# Patient Record
Sex: Male | Born: 1990 | Race: White | Hispanic: No | Marital: Single
Health system: Southern US, Community
[De-identification: ages and names within clinical notes are randomized; demographics above are authoritative.]

## PROBLEM LIST (undated history)

## (undated) ENCOUNTER — Emergency Department (HOSPITAL_COMMUNITY): Payer: Self-pay

## (undated) DIAGNOSIS — F419 Anxiety disorder, unspecified: Secondary | ICD-10-CM

## (undated) DIAGNOSIS — W3400XA Accidental discharge from unspecified firearms or gun, initial encounter: Secondary | ICD-10-CM

## (undated) DIAGNOSIS — Z5189 Encounter for other specified aftercare: Secondary | ICD-10-CM

## (undated) HISTORY — DX: Anxiety disorder, unspecified: F41.9

## (undated) HISTORY — DX: Encounter for other specified aftercare: Z51.89

---

## 2020-04-06 ENCOUNTER — Emergency Department (HOSPITAL_COMMUNITY)
Admission: EM | Admit: 2020-04-06 | Discharge: 2020-04-06 | Disposition: A | Discharge status: Home / Self Care | Payer: Medicaid - Out of State | Attending: Emergency Medicine | Admitting: Emergency Medicine

## 2020-04-06 ENCOUNTER — Encounter (HOSPITAL_COMMUNITY): Payer: Self-pay

## 2020-04-06 DIAGNOSIS — K0889 Other specified disorders of teeth and supporting structures: Secondary | ICD-10-CM | POA: Diagnosis not present

## 2020-04-06 DIAGNOSIS — M549 Dorsalgia, unspecified: Secondary | ICD-10-CM | POA: Diagnosis not present

## 2020-04-06 DIAGNOSIS — Z5321 Procedure and treatment not carried out due to patient leaving prior to being seen by health care provider: Secondary | ICD-10-CM | POA: Insufficient documentation

## 2020-04-06 DIAGNOSIS — M542 Cervicalgia: Secondary | ICD-10-CM | POA: Insufficient documentation

## 2020-04-06 DIAGNOSIS — G8929 Other chronic pain: Secondary | ICD-10-CM | POA: Insufficient documentation

## 2020-04-06 NOTE — ED Triage Notes (Signed)
Pt presents via EMS with c/o dental pain. Pt reports dental pain with chronic neck and back pain with a hx of arthritis.

## 2021-06-10 ENCOUNTER — Emergency Department (HOSPITAL_COMMUNITY)
Admission: EM | Admit: 2021-06-10 | Discharge: 2021-06-10 | Disposition: A | Discharge status: Home / Self Care | Payer: Medicaid - Out of State | Attending: Emergency Medicine | Admitting: Emergency Medicine

## 2021-06-10 ENCOUNTER — Other Ambulatory Visit: Payer: Self-pay

## 2021-06-10 ENCOUNTER — Emergency Department (HOSPITAL_COMMUNITY): Payer: Medicaid - Out of State

## 2021-06-10 DIAGNOSIS — G8929 Other chronic pain: Secondary | ICD-10-CM | POA: Diagnosis not present

## 2021-06-10 DIAGNOSIS — R112 Nausea with vomiting, unspecified: Secondary | ICD-10-CM | POA: Diagnosis not present

## 2021-06-10 DIAGNOSIS — R6883 Chills (without fever): Secondary | ICD-10-CM | POA: Diagnosis not present

## 2021-06-10 DIAGNOSIS — R059 Cough, unspecified: Secondary | ICD-10-CM | POA: Diagnosis not present

## 2021-06-10 DIAGNOSIS — R0981 Nasal congestion: Secondary | ICD-10-CM | POA: Diagnosis not present

## 2021-06-10 DIAGNOSIS — M545 Low back pain, unspecified: Secondary | ICD-10-CM | POA: Insufficient documentation

## 2021-06-10 DIAGNOSIS — F17210 Nicotine dependence, cigarettes, uncomplicated: Secondary | ICD-10-CM | POA: Diagnosis not present

## 2021-06-10 DIAGNOSIS — Z79899 Other long term (current) drug therapy: Secondary | ICD-10-CM | POA: Diagnosis not present

## 2021-06-10 DIAGNOSIS — K029 Dental caries, unspecified: Secondary | ICD-10-CM | POA: Diagnosis not present

## 2021-06-10 LAB — COMPREHENSIVE METABOLIC PANEL
ALT: 17 U/L (ref 0–44)
AST: 20 U/L (ref 15–41)
Albumin: 4.1 g/dL (ref 3.5–5.0)
Alkaline Phosphatase: 67 U/L (ref 38–126)
Anion gap: 7 (ref 5–15)
BUN: 12 mg/dL (ref 6–20)
CO2: 23 mmol/L (ref 22–32)
Calcium: 9 mg/dL (ref 8.9–10.3)
Chloride: 107 mmol/L (ref 98–111)
Creatinine, Ser: 0.85 mg/dL (ref 0.61–1.24)
GFR, Estimated: >60 mL/min (ref >60)
Glucose, Bld: 96 mg/dL (ref 70–99)
Potassium: 3.5 mmol/L (ref 3.5–5.1)
Sodium: 137 mmol/L (ref 135–145)
Total Bilirubin: 0.9 mg/dL (ref 0.3–1.2)
Total Protein: 6.7 g/dL (ref 6.5–8.1)

## 2021-06-10 LAB — URINALYSIS, ROUTINE W REFLEX MICROSCOPIC
Bacteria, UA: NONE SEEN
Bilirubin Urine: NEGATIVE
Glucose, UA: NEGATIVE mg/dL
Hgb urine dipstick: NEGATIVE
Ketones, ur: 20 mg/dL — AB
Leukocytes,Ua: NEGATIVE
Nitrite: NEGATIVE
Protein, ur: 100 mg/dL — AB
Specific Gravity, Urine: 1.032 — ABNORMAL HIGH (ref 1.005–1.030)
pH: 5 (ref 5.0–8.0)

## 2021-06-10 LAB — CBC WITH DIFFERENTIAL/PLATELET
Abs Immature Granulocytes: 0.02 10*3/uL (ref 0.00–0.07)
Basophils Absolute: 0.1 10*3/uL (ref 0.0–0.1)
Basophils Relative: 1 %
Eosinophils Absolute: 0 10*3/uL (ref 0.0–0.5)
Eosinophils Relative: 0 %
HCT: 45.2 % (ref 39.0–52.0)
Hemoglobin: 15.2 g/dL (ref 13.0–17.0)
Immature Granulocytes: 0 %
Lymphocytes Relative: 9 %
Lymphs Abs: 0.6 10*3/uL — ABNORMAL LOW (ref 0.7–4.0)
MCH: 29.5 pg (ref 26.0–34.0)
MCHC: 33.6 g/dL (ref 30.0–36.0)
MCV: 87.8 fL (ref 80.0–100.0)
Monocytes Absolute: 1.1 10*3/uL — ABNORMAL HIGH (ref 0.1–1.0)
Monocytes Relative: 19 %
Neutro Abs: 4.3 10*3/uL (ref 1.7–7.7)
Neutrophils Relative %: 71 %
Platelets: 162 10*3/uL (ref 150–400)
RBC: 5.15 MIL/uL (ref 4.22–5.81)
RDW: 12.6 % (ref 11.5–15.5)
WBC: 6.1 10*3/uL (ref 4.0–10.5)
nRBC: 0 % (ref 0.0–0.2)

## 2021-06-10 LAB — RAPID URINE DRUG SCREEN, HOSP PERFORMED
Amphetamines: NOT DETECTED
Barbiturates: NOT DETECTED
Benzodiazepines: NOT DETECTED
Cocaine: NOT DETECTED
Opiates: NOT DETECTED
Tetrahydrocannabinol: POSITIVE — AB

## 2021-06-10 MED ORDER — OXYCODONE-ACETAMINOPHEN 5-325 MG PO TABS
1.0000 | ORAL_TABLET | Freq: Once | ORAL | Status: AC
Start: 2021-06-10 — End: 2021-06-10
  Administered 2021-06-10: 1 via ORAL
  Filled 2021-06-10: qty 1

## 2021-06-10 MED ORDER — METHOCARBAMOL 500 MG PO TABS: 500.0000 mg | ORAL_TABLET | Freq: Two times a day (BID) | ORAL | 0 refills | Status: DC

## 2021-06-10 NOTE — ED Triage Notes (Signed)
Pt BIBA from home  Per EMS pt reports hot/cold flashes and intermittent nausea/vomiting last night and this morning reports sharp pain in lower back when standing up. believes he may have had a syncopal episode but not completely sure. Denies any numbness/tingling. Pt reports he does have some back pain regularly due to heavy lifting at work but describes this pain as worse.    101/74 HR 104 CBG 152 T 97.4

## 2021-06-10 NOTE — Discharge Instructions (Addendum)
You are seen in the emergency department today for back pain.  Lab work and imaging that we obtained was all normal.  It is likely that your symptoms are related to overuse from work.  We normally treat this symptomatically.  I am prescribing you a muscle relaxer called Robaxin, you can take 1 tablet twice daily.  While taking this medication do not combine with alcohol as it can make you sleepy.  Do not operate machinery continue to have makes you feel.  Please use Tylenol or ibuprofen for pain.  You may use 600 mg ibuprofen every 6 hours or 1000 mg of Tylenol every 6 hours.  You may choose to alternate between the 2.  This would be most effective.  Not to exceed 4 g of Tylenol within 24 hours.  Not to exceed 3200 mg ibuprofen 24 hours.   Continue to monitor how you're doing and return to the ER for new or worsening symptoms such as numbness, tingling, weakness, not being able to hold your stool or urine.   It has been a pleasure seeing and caring for you today and I hope you start feeling better soon!

## 2021-06-10 NOTE — ED Provider Notes (Signed)
[facility]-EMERGENCY DEPT Provider Note   CSN: [id number] Arrival date & time: 06/10/21  0919     History Chief Complaint  Patient presents with   Back Pain   Nausea    Gabriel Reed is a 30 y.o. male with no significant past medical history who presents with back pain x1 day.  Patient states he has chronic back pain due to osteoarthritis, felt increase in pain last night.  When he woke up this morning about 5 AM he described "splitting" pain in his lower back with associated chills, nausea, and vomiting.  His pain is made worse with walking and movement.  Has not checked temperature at home.  Took Tylenol this morning without relief.  No recent injuries.  Patient states that he works at [employer], and does lots of heavy lifting.  Also feels that he has had a "cold" for the past several days with associated nasal congestion and intermittent productive cough.  No numbness, tingling, weakness.   Reports history of dental infections, has not followed up with a dentist in several years.  Smokes 1 pack cigarettes per day, no alcohol use.  Endorses marijuana use.  No IV drug use.   Back Pain Associated symptoms: no abdominal pain, no chest pain and no dysuria       No past medical history on file.  There are no problems to display for this patient.   No past surgical history on file.     No family history on file.  Social History   Tobacco Use   Smoking status: Every Day   Smokeless tobacco: Never  Substance Use Topics   Alcohol use: Never   Drug use: Yes    Types: Marijuana    Home Medications Prior to Admission medications   Medication Sig Start Date End Date Taking? Authorizing Provider  methocarbamol (ROBAXIN) 500 MG tablet Take 1 tablet (500 mg total) by mouth 2 (two) times daily. 06/10/21  Yes Lisamarie Coke T, PA-C    Allergies    Amoxicillin, Bee venom, and Penicillins  Review of Systems   Review of Systems  Constitutional:  Positive  for chills.  HENT:  Positive for congestion. Negative for sore throat and trouble swallowing.   Respiratory:  Positive for cough. Negative for shortness of breath.   Cardiovascular:  Negative for chest pain.  Gastrointestinal:  Positive for nausea and vomiting. Negative for abdominal pain, constipation and diarrhea.  Genitourinary:  Negative for dysuria, frequency, hematuria and urgency.  Musculoskeletal:  Positive for back pain.  All other systems reviewed and are negative.  Physical Exam Updated Vital Signs BP 117/82   Pulse 68   Temp 98.7 F (37.1 C) (Oral)   Resp 16   Ht 6\' 1"  (1.854 m)   Wt 76.7 kg   SpO2 98%   BMI 22.30 kg/m   Physical Exam Vitals and nursing note reviewed.  Constitutional:      Appearance: Normal appearance.  HENT:     Head: Normocephalic and atraumatic.     Mouth/Throat:     Mouth: Mucous membranes are moist.     Pharynx: No oropharyngeal exudate or posterior oropharyngeal erythema.     Comments: Poor dentition with several missing teeth and caries. No obvious dental abscess.  Eyes:     Conjunctiva/sclera: Conjunctivae normal.  Cardiovascular:     Rate and Rhythm: Normal rate and regular rhythm.     Heart sounds: Normal heart sounds.  Pulmonary:     Effort: Pulmonary effort  is normal. No respiratory distress.     Breath sounds: Normal breath sounds.  Abdominal:     General: There is no distension.     Palpations: Abdomen is soft.     Tenderness: There is no abdominal tenderness. There is no right CVA tenderness, left CVA tenderness, guarding or rebound.  Musculoskeletal:     Comments: Full passive ROM of lumbar spine.  Point tenderness over lumbar spine no step-offs or crepitus.  Skin:    General: Skin is warm and dry.     Comments: No wounds or overlying skin changes.  Neurological:     General: No focal deficit present.     Mental Status: He is alert.     Comments: Neuro: Speech is clear, able to follow commands. Sensation intact  throughout. Str 5/5 all extremities.     ED Results / Procedures / Treatments   Labs (all labs ordered are listed, but only abnormal results are displayed) Labs Reviewed  CBC WITH DIFFERENTIAL/PLATELET - Abnormal; Notable for the following components:      Result Value   Lymphs Abs 0.6 (*)    Monocytes Absolute 1.1 (*)    All other components within normal limits  URINALYSIS, ROUTINE W REFLEX MICROSCOPIC - Abnormal; Notable for the following components:   Color, Urine AMBER (*)    APPearance HAZY (*)    Specific Gravity, Urine 1.032 (*)    Ketones, ur 20 (*)    Protein, ur 100 (*)    All other components within normal limits  RAPID URINE DRUG SCREEN, HOSP PERFORMED - Abnormal; Notable for the following components:   Tetrahydrocannabinol POSITIVE (*)    All other components within normal limits  COMPREHENSIVE METABOLIC PANEL    EKG None  Radiology CT Lumbar Spine Wo Contrast  Result Date: 06/10/2021 CLINICAL DATA:  Sharp back pain.  Possible syncope EXAM: CT LUMBAR SPINE WITHOUT CONTRAST TECHNIQUE: Multidetector CT imaging of the lumbar spine was performed without intravenous contrast administration. Multiplanar CT image reconstructions were also generated. COMPARISON:  None. FINDINGS: Segmentation: 5 lumbar type vertebrae. Alignment: Normal. Vertebrae: No acute fracture or focal pathologic process. Paraspinal and other soft tissues: Negative Disc levels: No visible herniation or focal degenerative changes. IMPRESSION: Negative study Electronically Signed   By: Tiburcio Pea M.D.   On: 06/10/2021 10:36    Procedures Procedures   Medications Ordered in ED Medications  oxyCODONE-acetaminophen (PERCOCET/ROXICET) 5-325 MG per tablet 1 tablet (1 tablet Oral Given 06/10/21 1008)    ED Course  I have reviewed the triage vital signs and the nursing notes.  Pertinent labs & imaging results that were available during my care of the patient were reviewed by me and considered in my  medical decision making (see chart for details).    MDM Rules/Calculators/A&P                           Patient is 30 y/o male who presents with acute back pain with associated chills, nausea, and vomiting x 1 day.  States he does lots of heavy lifting at work, woke up this morning with "splitting" back pain.  Reports history of dental infections.  Does not take any medications on a daily basis.  No loss of bowel or bladder control.  No concern for cauda equina.  No fever, night sweats, weight loss, h/o cancer, IVDU.   The emergent differential diagnosis for back pain includes but is not limited to fracture, muscle strain,  cauda equina, spinal stenosis. DDD, ankylosing spondylitis, acute ligamentous injury, disk herniation, spondylolisthesis, Epidural compression syndrome, metastatic cancer, transverse myelitis, vertebral osteomyelitis, diskitis, kidney stone, pyelonephritis, AAA, Perforated ulcer, Retrocecal appendicitis, pancreatitis, bowel obstruction, retroperitoneal hemorrhage or mass, meningitis.   On my exam patient is afebrile, no acute distress.  He has point tenderness over his lumbar spine.  No step-offs or crepitus.  No wounds or overlying skin changes.  Full passive ROM of lumbar spine.  Neurovascularly intact as above.  Lab work unremarkable.  Urinalysis consistent with dehydration, no evidence of UTI or hematuria.  UDS positive for THC.  CT lumbar spine shows no acute abnormalities.  Highly doubt infection as patient is afebrile, there is no leukocytosis, and no inflammatory changes on imaging.  Likely symptoms related to overuse.  She does not require admission or inpatient treatment for his symptoms at this time.  Plan to discharge home with prescription for muscle relaxer.  Given strict return precautions. Patient agreeable to plan.  Final Clinical Impression(s) / ED Diagnoses Final diagnoses:  Acute midline low back pain without sciatica    Rx / DC Orders ED Discharge Orders           Ordered    methocarbamol (ROBAXIN) 500 MG tablet  2 times daily        06/10/21 1152             Marquisha Nikolov T, PA-C 06/10/21 1155    Derwood Kaplan, MD 06/10/21 1439

## 2022-02-21 ENCOUNTER — Ambulatory Visit: Payer: Medicaid - Out of State | Admitting: Family Medicine

## 2022-03-14 ENCOUNTER — Ambulatory Visit: Payer: Self-pay

## 2022-04-26 ENCOUNTER — Other Ambulatory Visit: Payer: Self-pay

## 2022-04-26 ENCOUNTER — Encounter (HOSPITAL_COMMUNITY): Payer: Self-pay

## 2022-04-26 ENCOUNTER — Emergency Department (HOSPITAL_COMMUNITY)
Admission: EM | Admit: 2022-04-26 | Discharge: 2022-04-26 | Disposition: A | Discharge status: Home / Self Care | Payer: Medicaid - Out of State | Attending: Emergency Medicine | Admitting: Emergency Medicine

## 2022-04-26 DIAGNOSIS — K029 Dental caries, unspecified: Secondary | ICD-10-CM | POA: Insufficient documentation

## 2022-04-26 MED ORDER — CLINDAMYCIN HCL 150 MG PO CAPS: 300.0000 mg | ORAL_CAPSULE | Freq: Three times a day (TID) | ORAL | 0 refills | Status: AC

## 2022-04-26 NOTE — ED Provider Notes (Signed)
[facility]   CSN: [id number] Arrival date & time: 04/26/22  6283     History  Chief Complaint  Patient presents with   Dental Pain    Gabriel Reed is a 31 y.o. male.   Dental Pain    Patient presents to the ED for toothache.  Patient states he has a history of multiple dental caries.  He has had multiple teeth pulled.  Last time he saw dentist was 4 years ago.  Patient states after he had his teeth pulled this new 1 ended up coming up through the gums.  He has been having some intermittent issues with it since then.  Patient states he started having pain that increased in the last few days.  He is not having any fevers or chills.  No difficulty swallowing or speaking.  Patient states usually when he has a flare antibiotics help it.  He came to the ED for that.  Home Medications Prior to Admission medications   Medication Sig Start Date End Date Taking? Authorizing Provider  clindamycin (CLEOCIN) 150 MG capsule Take 2 capsules (300 mg total) by mouth 3 (three) times daily for 7 days. 04/26/22 05/03/22 Yes Linwood Dibbles, MD  methocarbamol (ROBAXIN) 500 MG tablet Take 1 tablet (500 mg total) by mouth 2 (two) times daily. 06/10/21   Roemhildt, Lorin T, PA-C      Allergies    Amoxicillin, Bee venom, and Penicillins    Review of Systems   Review of Systems  Physical Exam Updated Vital Signs BP 122/83 (BP Location: Right Arm)   Pulse 67   Temp 98.3 F (36.8 C) (Oral)   Resp 16   Ht 1.854 m (6\' 1" )   Wt 77.1 kg   SpO2 94%   BMI 22.43 kg/m  Physical Exam Vitals and nursing note reviewed.  Constitutional:      General: He is not in acute distress.    Appearance: He is well-developed.  HENT:     Head: Normocephalic and atraumatic.     Right Ear: External ear normal.     Left Ear: External ear normal.     Mouth/Throat:     Dentition: Dental caries present. No gingival swelling or dental abscesses.     Pharynx: No oropharyngeal  exudate or posterior oropharyngeal erythema.     Comments: Right posterior molar region tooth with obvious decay, it is brown in color, no surrounding erythema or swelling Eyes:     General: No scleral icterus.       Right eye: No discharge.        Left eye: No discharge.     Conjunctiva/sclera: Conjunctivae normal.  Neck:     Trachea: No tracheal deviation.  Cardiovascular:     Rate and Rhythm: Normal rate.  Pulmonary:     Effort: Pulmonary effort is normal. No respiratory distress.     Breath sounds: No stridor.  Abdominal:     General: There is no distension.  Musculoskeletal:        General: No swelling or deformity.     Cervical back: Neck supple.  Skin:    General: Skin is warm and dry.     Findings: No rash.  Neurological:     Mental Status: He is alert.     Cranial Nerves: Cranial nerve deficit: no gross deficits.     ED Results / Procedures / Treatments   Labs (all labs ordered are listed, but only abnormal results are displayed)  Labs Reviewed - No data to display  EKG None  Radiology No results found.  Procedures Procedures    Medications Ordered in ED Medications - No data to display  ED Course/ Medical Decision Making/ A&P                           Medical Decision Making Problems Addressed: Dental caries: acute illness or injury   Patient appears to have a decayed tooth.  Suspect he had a wisdom tooth that has erupted through the gums after having his other teeth pulled.  Will start him on a course of antibiotics for possible dental infection although there is no signs of severe swelling or obvious abscess.  Recommend close outpatient follow-up with a dentist.  Will provide outpatient resource information        Final Clinical Impression(s) / ED Diagnoses Final diagnoses:  Dental caries    Rx / DC Orders ED Discharge Orders          Ordered    clindamycin (CLEOCIN) 150 MG capsule  3 times daily        04/26/22 0903               Linwood Dibbles, MD 04/26/22 [id number]

## 2022-04-26 NOTE — Discharge Instructions (Signed)
Follow-up with a dentist as soon as possible.

## 2022-04-26 NOTE — ED Notes (Signed)
Pt stated he is having R sided jaw pain and "every time this happens it's usually infected". Pt a/o and conversational at this time. No immediate complaints noted.

## 2022-04-26 NOTE — ED Triage Notes (Signed)
Patient c/o right lower dental pain x 2 months and worse in the past few days. Patient states when he swallows he has pain in his right ear and throat.

## 2022-12-19 ENCOUNTER — Emergency Department (HOSPITAL_COMMUNITY): Payer: Medicaid Other

## 2022-12-19 ENCOUNTER — Other Ambulatory Visit: Payer: Self-pay

## 2022-12-19 ENCOUNTER — Emergency Department (HOSPITAL_COMMUNITY): Payer: Medicaid Other | Admitting: Anesthesiology

## 2022-12-19 ENCOUNTER — Encounter (HOSPITAL_COMMUNITY): Admission: EM | Disposition: A | Discharge status: Home / Self Care | Payer: Self-pay | Source: Home / Self Care

## 2022-12-19 ENCOUNTER — Inpatient Hospital Stay (HOSPITAL_COMMUNITY)
Admission: EM | Admit: 2022-12-19 | Discharge: 2022-12-23 | DRG: 163 | Disposition: A | Discharge status: Home / Self Care | Payer: Medicaid Other | Attending: General Surgery | Admitting: General Surgery

## 2022-12-19 DIAGNOSIS — Z880 Allergy status to penicillin: Secondary | ICD-10-CM | POA: Diagnosis not present

## 2022-12-19 DIAGNOSIS — F17210 Nicotine dependence, cigarettes, uncomplicated: Secondary | ICD-10-CM

## 2022-12-19 DIAGNOSIS — Z91030 Bee allergy status: Secondary | ICD-10-CM

## 2022-12-19 DIAGNOSIS — R578 Other shock: Secondary | ICD-10-CM | POA: Diagnosis present

## 2022-12-19 DIAGNOSIS — F4310 Post-traumatic stress disorder, unspecified: Secondary | ICD-10-CM | POA: Diagnosis present

## 2022-12-19 DIAGNOSIS — S21131A Puncture wound without foreign body of right front wall of thorax without penetration into thoracic cavity, initial encounter: Secondary | ICD-10-CM | POA: Diagnosis present

## 2022-12-19 DIAGNOSIS — Y9259 Other trade areas as the place of occurrence of the external cause: Secondary | ICD-10-CM

## 2022-12-19 DIAGNOSIS — S31139A Puncture wound of abdominal wall without foreign body, unspecified quadrant without penetration into peritoneal cavity, initial encounter: Secondary | ICD-10-CM | POA: Diagnosis not present

## 2022-12-19 DIAGNOSIS — F4322 Adjustment disorder with anxiety: Secondary | ICD-10-CM | POA: Diagnosis present

## 2022-12-19 DIAGNOSIS — J9601 Acute respiratory failure with hypoxia: Secondary | ICD-10-CM | POA: Diagnosis present

## 2022-12-19 DIAGNOSIS — S2231XA Fracture of one rib, right side, initial encounter for closed fracture: Secondary | ICD-10-CM | POA: Diagnosis present

## 2022-12-19 DIAGNOSIS — W3400XA Accidental discharge from unspecified firearms or gun, initial encounter: Secondary | ICD-10-CM | POA: Diagnosis not present

## 2022-12-19 DIAGNOSIS — Z23 Encounter for immunization: Secondary | ICD-10-CM

## 2022-12-19 DIAGNOSIS — F430 Acute stress reaction: Secondary | ICD-10-CM | POA: Diagnosis present

## 2022-12-19 DIAGNOSIS — S271XXA Traumatic hemothorax, initial encounter: Secondary | ICD-10-CM | POA: Diagnosis present

## 2022-12-19 DIAGNOSIS — D62 Acute posthemorrhagic anemia: Secondary | ICD-10-CM | POA: Diagnosis present

## 2022-12-19 DIAGNOSIS — S272XXA Traumatic hemopneumothorax, initial encounter: Secondary | ICD-10-CM | POA: Diagnosis not present

## 2022-12-19 HISTORY — PX: THORACOTOMY: SHX5074

## 2022-12-19 LAB — I-STAT CHEM 8, ED
BUN: 15 mg/dL (ref 6–20)
Calcium, Ion: 1.24 mmol/L (ref 1.15–1.40)
Chloride: 102 mmol/L (ref 98–111)
Creatinine, Ser: 1 mg/dL (ref 0.61–1.24)
Glucose, Bld: 111 mg/dL — ABNORMAL HIGH (ref 70–99)
HCT: 44 % (ref 39.0–52.0)
Hemoglobin: 15 g/dL (ref 13.0–17.0)
Potassium: 4.2 mmol/L (ref 3.5–5.1)
Sodium: 140 mmol/L (ref 135–145)
TCO2: 29 mmol/L (ref 22–32)

## 2022-12-19 LAB — CBC
HCT: 43 % (ref 39.0–52.0)
Hemoglobin: 14.5 g/dL (ref 13.0–17.0)
MCH: 29.7 pg (ref 26.0–34.0)
MCHC: 33.7 g/dL (ref 30.0–36.0)
MCV: 87.9 fL (ref 80.0–100.0)
Platelets: 281 10*3/uL (ref 150–400)
RBC: 4.89 MIL/uL (ref 4.22–5.81)
RDW: 12.9 % (ref 11.5–15.5)
WBC: 14 10*3/uL — ABNORMAL HIGH (ref 4.0–10.5)
nRBC: 0 % (ref 0.0–0.2)

## 2022-12-19 LAB — COMPREHENSIVE METABOLIC PANEL
ALT: 14 U/L (ref 0–44)
AST: 19 U/L (ref 15–41)
Albumin: 3.2 g/dL — ABNORMAL LOW (ref 3.5–5.0)
Alkaline Phosphatase: 62 U/L (ref 38–126)
Anion gap: 12 (ref 5–15)
BUN: 13 mg/dL (ref 6–20)
CO2: 22 mmol/L (ref 22–32)
Calcium: 8.8 mg/dL — ABNORMAL LOW (ref 8.9–10.3)
Chloride: 104 mmol/L (ref 98–111)
Creatinine, Ser: 1.09 mg/dL (ref 0.61–1.24)
GFR, Estimated: >60 mL/min (ref >60)
Glucose, Bld: 134 mg/dL — ABNORMAL HIGH (ref 70–99)
Potassium: 4 mmol/L (ref 3.5–5.1)
Sodium: 138 mmol/L (ref 135–145)
Total Bilirubin: 0.5 mg/dL (ref 0.3–1.2)
Total Protein: 5.3 g/dL — ABNORMAL LOW (ref 6.5–8.1)

## 2022-12-19 LAB — PREPARE RBC (CROSSMATCH)

## 2022-12-19 LAB — PROTIME-INR
INR: 1.2 (ref 0.8–1.2)
Prothrombin Time: 15.4 seconds — ABNORMAL HIGH (ref 11.4–15.2)

## 2022-12-19 LAB — ETHANOL: Alcohol, Ethyl (B): <10 mg/dL (ref <10)

## 2022-12-19 LAB — PREPARE FRESH FROZEN PLASMA

## 2022-12-19 LAB — BPAM RBC
Blood Product Expiration Date: 202404302359
ISSUE DATE / TIME: 202404241530
ISSUE DATE / TIME: 202404252115
Unit Type and Rh: 5100
Unit Type and Rh: 9500

## 2022-12-19 LAB — TYPE AND SCREEN
Unit division: 0
Unit division: 0

## 2022-12-19 LAB — BPAM PLATELET PHERESIS
Blood Product Expiration Date: 202404272359
Unit Type and Rh: 6200

## 2022-12-19 LAB — BPAM FFP
Blood Product Expiration Date: 202404292359
ISSUE DATE / TIME: 202404252127
ISSUE DATE / TIME: 202404252226
Unit Type and Rh: 6200

## 2022-12-19 LAB — BPAM CRYOPRECIPITATE

## 2022-12-19 LAB — LACTIC ACID, PLASMA: Lactic Acid, Venous: 3.1 mmol/L (ref 0.5–1.9)

## 2022-12-19 LAB — PREPARE PLATELET PHERESIS

## 2022-12-19 LAB — ABO/RH: ABO/RH(D): O NEG

## 2022-12-19 SURGERY — THORACOTOMY, MAJOR
Anesthesia: General | Site: Chest | Laterality: Right

## 2022-12-19 MED ORDER — HEMOSTATIC AGENTS (NO CHARGE) OPTIME
TOPICAL | Status: DC | PRN
  Administered 2022-12-19: 1 via TOPICAL

## 2022-12-19 MED ORDER — IOHEXOL 350 MG/ML SOLN
75.0000 mL | Freq: Once | INTRAVENOUS | Status: AC | PRN
  Administered 2022-12-19: 75 mL via INTRAVENOUS

## 2022-12-19 MED ORDER — SODIUM CHLORIDE 0.9 % IV BOLUS
1000.0000 mL | Freq: Once | INTRAVENOUS | Status: AC
  Administered 2022-12-19: 1000 mL via INTRAVENOUS

## 2022-12-19 MED ORDER — TETANUS-DIPHTH-ACELL PERTUSSIS 5-2.5-18.5 LF-MCG/0.5 IM SUSY
0.5000 mL | PREFILLED_SYRINGE | Freq: Once | INTRAMUSCULAR | Status: AC
  Administered 2022-12-19: 0.5 mL via INTRAMUSCULAR

## 2022-12-19 MED ORDER — MORPHINE SULFATE (PF) 4 MG/ML IV SOLN: 4.0000 mg | Freq: Once | INTRAVENOUS | Status: DC

## 2022-12-19 MED ORDER — LACTATED RINGERS IV BOLUS
1000.0000 mL | Freq: Once | INTRAVENOUS | Status: AC
  Administered 2022-12-19: 1000 mL via INTRAVENOUS

## 2022-12-19 MED ORDER — FENTANYL CITRATE (PF) 250 MCG/5ML IJ SOLN
INTRAMUSCULAR | Status: AC
  Filled 2022-12-19: qty 5

## 2022-12-19 MED ORDER — 0.9 % SODIUM CHLORIDE (POUR BTL) OPTIME
TOPICAL | Status: DC | PRN
  Administered 2022-12-19: 2000 mL

## 2022-12-19 MED ORDER — DEXAMETHASONE SODIUM PHOSPHATE 10 MG/ML IJ SOLN
INTRAMUSCULAR | Status: DC | PRN
  Administered 2022-12-19: 10 mg via INTRAVENOUS

## 2022-12-19 MED ORDER — LACTATED RINGERS IV SOLN: INTRAVENOUS | Status: DC | PRN

## 2022-12-19 MED ORDER — PROPOFOL 10 MG/ML IV BOLUS
INTRAVENOUS | Status: AC
  Filled 2022-12-19: qty 20

## 2022-12-19 MED ORDER — SODIUM CHLORIDE 0.9% IV SOLUTION: Freq: Once | INTRAVENOUS | Status: DC

## 2022-12-19 MED ORDER — PHENYLEPHRINE 80 MCG/ML (10ML) SYRINGE FOR IV PUSH (FOR BLOOD PRESSURE SUPPORT)
PREFILLED_SYRINGE | INTRAVENOUS | Status: DC | PRN
  Administered 2022-12-19 (×2): 160 ug via INTRAVENOUS

## 2022-12-19 MED ORDER — CALCIUM GLUCONATE-NACL 1-0.675 GM/50ML-% IV SOLN
1.0000 g | Freq: Once | INTRAVENOUS | Status: AC
  Administered 2022-12-20: 1000 mg via INTRAVENOUS
  Filled 2022-12-19: qty 50

## 2022-12-19 MED ORDER — MORPHINE SULFATE (PF) 2 MG/ML IV SOLN
INTRAVENOUS | Status: AC
  Administered 2022-12-19: 4 mg via INTRAVENOUS
  Filled 2022-12-19: qty 2

## 2022-12-19 MED ORDER — HYDROMORPHONE HCL 1 MG/ML IJ SOLN
INTRAMUSCULAR | Status: AC
  Filled 2022-12-19: qty 0.5

## 2022-12-19 MED ORDER — LIDOCAINE 2% (20 MG/ML) 5 ML SYRINGE
INTRAMUSCULAR | Status: DC | PRN
  Administered 2022-12-19: 60 mg via INTRAVENOUS

## 2022-12-19 MED ORDER — SODIUM CHLORIDE 0.9 % IV SOLN: INTRAVENOUS | Status: DC | PRN

## 2022-12-19 MED ORDER — SUCCINYLCHOLINE CHLORIDE 200 MG/10ML IV SOSY
PREFILLED_SYRINGE | INTRAVENOUS | Status: DC | PRN
  Administered 2022-12-19: 120 mg via INTRAVENOUS

## 2022-12-19 MED ORDER — ROCURONIUM BROMIDE 10 MG/ML (PF) SYRINGE
PREFILLED_SYRINGE | INTRAVENOUS | Status: DC | PRN
  Administered 2022-12-19: 70 mg via INTRAVENOUS

## 2022-12-19 MED ORDER — KETAMINE HCL 50 MG/5ML IJ SOSY
PREFILLED_SYRINGE | INTRAMUSCULAR | Status: AC
  Filled 2022-12-19: qty 5

## 2022-12-19 MED ORDER — KETAMINE HCL 10 MG/ML IJ SOLN
INTRAMUSCULAR | Status: DC | PRN
  Administered 2022-12-19: 30 mg via INTRAVENOUS
  Administered 2022-12-19: 20 mg via INTRAVENOUS
  Administered 2022-12-20: 10 mg via INTRAVENOUS
  Administered 2022-12-20: 20 mg via INTRAVENOUS

## 2022-12-19 MED ORDER — PROPOFOL 10 MG/ML IV BOLUS
INTRAVENOUS | Status: DC | PRN
  Administered 2022-12-19: 150 mg via INTRAVENOUS
  Administered 2022-12-19: 50 mg via INTRAVENOUS
  Administered 2022-12-19: 70 mg via INTRAVENOUS

## 2022-12-19 MED ORDER — HYDROMORPHONE HCL 1 MG/ML IJ SOLN
INTRAMUSCULAR | Status: DC | PRN
  Administered 2022-12-19 (×2): .5 mg via INTRAVENOUS

## 2022-12-19 MED ORDER — ACETAMINOPHEN 10 MG/ML IV SOLN
INTRAVENOUS | Status: AC
  Filled 2022-12-19: qty 100

## 2022-12-19 MED ORDER — CEFAZOLIN SODIUM-DEXTROSE 2-4 GM/100ML-% IV SOLN
2.0000 g | Freq: Once | INTRAVENOUS | Status: AC
  Administered 2022-12-19: 2 g via INTRAVENOUS

## 2022-12-19 MED ORDER — PHENYLEPHRINE HCL-NACL 20-0.9 MG/250ML-% IV SOLN
INTRAVENOUS | Status: DC | PRN
  Administered 2022-12-19: 40 ug/min via INTRAVENOUS

## 2022-12-19 SURGICAL SUPPLY — 81 items
APPLIER CLIP ROT 10 11.4 M/L (STAPLE)
BIT DRILL 7/64X5 DISP (BIT) IMPLANT
BLADE CLIPPER SURG (BLADE) ×1 IMPLANT
CANISTER SUCT 3000ML PPV (MISCELLANEOUS) ×2 IMPLANT
CATH THORACIC 28FR (CATHETERS) IMPLANT
CATH THORACIC 28FR RT ANG (CATHETERS) IMPLANT
CATH THORACIC 36FR (CATHETERS) IMPLANT
CATH THORACIC 36FR RT ANG (CATHETERS) IMPLANT
CLIP APPLIE ROT 10 11.4 M/L (STAPLE) IMPLANT
CLIP TI MEDIUM 6 (CLIP) ×1 IMPLANT
CNTNR URN SCR LID CUP LEK RST (MISCELLANEOUS) IMPLANT
CONN ST 1/4X3/8  BEN (MISCELLANEOUS) ×1
CONN ST 1/4X3/8 BEN (MISCELLANEOUS) IMPLANT
CONT SPEC 4OZ STRL OR WHT (MISCELLANEOUS) ×1
DERMABOND ADVANCED .7 DNX12 (GAUZE/BANDAGES/DRESSINGS) IMPLANT
DRAPE ORTHO SPLIT 77X108 STRL (DRAPES) ×1
DRAPE POUCH INSTRU U-SHP 10X18 (DRAPES) IMPLANT
DRAPE SURG ORHT 6 SPLT 77X108 (DRAPES) IMPLANT
DRSG AQUACEL AG ADV 3.5X14 (GAUZE/BANDAGES/DRESSINGS) ×1 IMPLANT
ELECT BLADE 6.5 EXT (BLADE) ×1 IMPLANT
ELECT REM PT RETURN 9FT ADLT (ELECTROSURGICAL) ×1
ELECTRODE REM PT RTRN 9FT ADLT (ELECTROSURGICAL) ×1 IMPLANT
FELT TEFLON 1X6 (MISCELLANEOUS) ×1 IMPLANT
GAUZE 4X4 16PLY ~~LOC~~+RFID DBL (SPONGE) ×1 IMPLANT
GAUZE SPONGE 4X4 12PLY STRL (GAUZE/BANDAGES/DRESSINGS) ×1 IMPLANT
GLOVE SS BIOGEL STRL SZ 7.5 (GLOVE) ×1 IMPLANT
GLOVE SURG SIGNA 7.5 PF LTX (GLOVE) ×3 IMPLANT
GOWN STRL REUS W/ TWL LRG LVL3 (GOWN DISPOSABLE) ×2 IMPLANT
GOWN STRL REUS W/ TWL XL LVL3 (GOWN DISPOSABLE) ×1 IMPLANT
GOWN STRL REUS W/TWL LRG LVL3 (GOWN DISPOSABLE) ×2
GOWN STRL REUS W/TWL XL LVL3 (GOWN DISPOSABLE) ×1
HEMOSTAT SURGICEL 2X14 (HEMOSTASIS) IMPLANT
INSERT FOGARTY 61MM (MISCELLANEOUS) IMPLANT
KIT BASIN OR (CUSTOM PROCEDURE TRAY) ×1 IMPLANT
KIT SUCTION CATH 14FR (SUCTIONS) ×1 IMPLANT
KIT TURNOVER KIT B (KITS) ×1 IMPLANT
NS IRRIG 1000ML POUR BTL (IV SOLUTION) ×3 IMPLANT
PACK CHEST (CUSTOM PROCEDURE TRAY) ×1 IMPLANT
PACK UNIVERSAL I (CUSTOM PROCEDURE TRAY) IMPLANT
PAD ARMBOARD 7.5X6 YLW CONV (MISCELLANEOUS) ×2 IMPLANT
PASSER SUT SWANSON 36MM LOOP (INSTRUMENTS) IMPLANT
PENCIL BUTTON HOLSTER BLD 10FT (ELECTRODE) IMPLANT
RELOAD STAPLE 60 BLK XTHK ART (STAPLE) IMPLANT
RELOAD TRI 2.0 60 XTHK VAS SUL (STAPLE) ×3 IMPLANT
SEALANT SURG COSEAL 8ML (VASCULAR PRODUCTS) IMPLANT
SPONGE T-LAP 18X18 ~~LOC~~+RFID (SPONGE) ×4 IMPLANT
SPONGE T-LAP 4X18 ~~LOC~~+RFID (SPONGE) ×1 IMPLANT
SPONGE TONSIL 1 RF SGL (DISPOSABLE) ×1 IMPLANT
STAPLER ENDO GIA 12 SHRT THIN (STAPLE) IMPLANT
STAPLER ENDO GIA 12MM SHORT (STAPLE) ×1 IMPLANT
STAPLER VISISTAT 35W (STAPLE) IMPLANT
STOPCOCK 4 WAY LG BORE MALE ST (IV SETS) ×1 IMPLANT
SUT CHROMIC 4 0 SH 27 (SUTURE) IMPLANT
SUT PROLENE 1 XLH (SUTURE) ×4 IMPLANT
SUT PROLENE 2 0 MH 48 (SUTURE) IMPLANT
SUT PROLENE 2 0 SH DA (SUTURE) IMPLANT
SUT PROLENE 3 0 SH 48 (SUTURE) IMPLANT
SUT PROLENE 4 0 RB 1 (SUTURE) ×5
SUT PROLENE 4 0 SH DA (SUTURE) IMPLANT
SUT PROLENE 4-0 RB1 .5 CRCL 36 (SUTURE) ×1 IMPLANT
SUT PROLENE 4-0 RB1 18X2 ARM (SUTURE) IMPLANT
SUT SILK  1 MH (SUTURE) ×2
SUT SILK 1 MH (SUTURE) ×2 IMPLANT
SUT SILK 2 0SH CR/8 30 (SUTURE) ×1 IMPLANT
SUT VIC AB 1 CTX 18 (SUTURE) IMPLANT
SUT VIC AB 1 CTX 27 (SUTURE) IMPLANT
SUT VIC AB 1 CTX 36 (SUTURE)
SUT VIC AB 1 CTX36XBRD ANBCTR (SUTURE) ×1 IMPLANT
SUT VIC AB 2-0 CTX 27 (SUTURE) IMPLANT
SUT VIC AB 3-0 X1 27 (SUTURE) ×1 IMPLANT
SUT VICRYL 2 TP 1 (SUTURE) ×1 IMPLANT
SWAB CULTURE ESWAB REG 1ML (MISCELLANEOUS) IMPLANT
SYR 10ML LL (SYRINGE) ×1 IMPLANT
SYR 20ML LL LF (SYRINGE) IMPLANT
SYR 50ML LL SCALE MARK (SYRINGE) ×1 IMPLANT
SYSTEM SAHARA CHEST DRAIN ATS (WOUND CARE) ×1 IMPLANT
TAPE CLOTH SURG 4X10 WHT LF (GAUZE/BANDAGES/DRESSINGS) IMPLANT
TOWEL GREEN STERILE (TOWEL DISPOSABLE) ×2 IMPLANT
TRAY FOLEY SLVR 16FR LF STAT (SET/KITS/TRAYS/PACK) ×1 IMPLANT
TUBING EXTENTION W/L.L. (IV SETS) IMPLANT
WATER STERILE IRR 1000ML POUR (IV SOLUTION) ×1 IMPLANT

## 2022-12-19 NOTE — Anesthesia Procedure Notes (Signed)
Procedure Name: Intubation Date/Time: 12/19/2022 10:47 PM  Performed by: Molli Hazard, CRNAPre-anesthesia Checklist: Patient identified, Emergency Drugs available, Suction available and Patient being monitored Patient Re-evaluated:Patient Re-evaluated prior to induction Oxygen Delivery Method: Circle system utilized Preoxygenation: Pre-oxygenation with 100% oxygen Induction Type: IV induction and Rapid sequence Laryngoscope Size: Glidescope Grade View: Grade I Endobronchial tube: Left, EBT position confirmed by auscultation, EBT position confirmed by fiberoptic bronchoscope and Double lumen EBT Number of attempts: 2 Airway Equipment and Method: Stylet Placement Confirmation: ETT inserted through vocal cords under direct vision, positive ETCO2 and breath sounds checked- equal and bilateral Tube secured with: Tape Dental Injury: Teeth and Oropharynx as per pre-operative assessment

## 2022-12-19 NOTE — ED Notes (Signed)
Cardiothoracic consulted
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
## 2022-12-19 NOTE — Anesthesia Procedure Notes (Signed)
Central Venous Catheter Insertion Performed by: Beryle Lathe, MD, anesthesiologist Start/End4/25/2024 9:58 PM, 12/19/2022 10:05 PM Patient location: OR. Emergency situation Preanesthetic checklist: patient identified, IV checked, monitors and equipment checked, pre-op evaluation, timeout performed and anesthesia consent Position: supine Patient sedated Hand hygiene performed , maximum sterile barriers used  and Seldinger technique used Catheter size: 8 Fr Central line was placed.Double lumen Procedure performed using ultrasound guided technique. Ultrasound Notes:anatomy identified, needle tip was noted to be adjacent to the nerve/plexus identified, no ultrasound evidence of intravascular and/or intraneural injection and image(s) printed for medical record Attempts: 1 Following insertion, line sutured, dressing applied and Biopatch. Post procedure assessment: blood return through all ports, free fluid flow and no air  Patient tolerated the procedure well with no immediate complications.
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
## 2022-12-19 NOTE — ED Notes (Signed)
Chest Tube Placed

## 2022-12-19 NOTE — ED Notes (Signed)
Pt transported to CT ?

## 2022-12-19 NOTE — Anesthesia Procedure Notes (Signed)
Arterial Line Insertion Start/End4/25/2024 10:38 PM, 12/19/2022 10:48 PM Performed by: Aundria Rud, CRNA, CRNA  Patient location: OR. Left, radial was placed Hand hygiene performed , maximum sterile barriers used  and Seldinger technique used  Attempts: 1 Procedure performed without using ultrasound guided technique. Following insertion, dressing applied and Biopatch. Post procedure assessment: normal
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
## 2022-12-20 ENCOUNTER — Other Ambulatory Visit: Payer: Self-pay

## 2022-12-20 ENCOUNTER — Inpatient Hospital Stay (HOSPITAL_COMMUNITY): Payer: Medicaid Other

## 2022-12-20 ENCOUNTER — Encounter (HOSPITAL_COMMUNITY): Payer: Self-pay | Admitting: Thoracic Surgery (Cardiothoracic Vascular Surgery)

## 2022-12-20 DIAGNOSIS — W3400XA Accidental discharge from unspecified firearms or gun, initial encounter: Secondary | ICD-10-CM

## 2022-12-20 DIAGNOSIS — S272XXA Traumatic hemopneumothorax, initial encounter: Secondary | ICD-10-CM | POA: Diagnosis not present

## 2022-12-20 LAB — PREPARE FRESH FROZEN PLASMA
Unit division: 0
Unit division: 0
Unit division: 0

## 2022-12-20 LAB — BASIC METABOLIC PANEL
Anion gap: 8 (ref 5–15)
BUN: 10 mg/dL (ref 6–20)
CO2: 25 mmol/L (ref 22–32)
Calcium: 8.3 mg/dL — ABNORMAL LOW (ref 8.9–10.3)
Chloride: 103 mmol/L (ref 98–111)
Creatinine, Ser: 0.91 mg/dL (ref 0.61–1.24)
GFR, Estimated: >60 mL/min (ref >60)
Glucose, Bld: 182 mg/dL — ABNORMAL HIGH (ref 70–99)
Potassium: 3.9 mmol/L (ref 3.5–5.1)
Sodium: 136 mmol/L (ref 135–145)

## 2022-12-20 LAB — POCT I-STAT 7, (LYTES, BLD GAS, ICA,H+H)
Acid-base deficit: 1 mmol/L (ref 0.0–2.0)
Bicarbonate: 24.9 mmol/L (ref 20.0–28.0)
Calcium, Ion: 1.18 mmol/L (ref 1.15–1.40)
HCT: 36 % — ABNORMAL LOW (ref 39.0–52.0)
Hemoglobin: 12.2 g/dL — ABNORMAL LOW (ref 13.0–17.0)
O2 Saturation: 100 %
Potassium: 3.9 mmol/L (ref 3.5–5.1)
Sodium: 139 mmol/L (ref 135–145)
TCO2: 26 mmol/L (ref 22–32)
pCO2 arterial: 43.6 mmHg (ref 32–48)
pH, Arterial: 7.365 (ref 7.35–7.45)
pO2, Arterial: 222 mmHg — ABNORMAL HIGH (ref 83–108)

## 2022-12-20 LAB — CBC
HCT: 36.3 % — ABNORMAL LOW (ref 39.0–52.0)
Hemoglobin: 12.4 g/dL — ABNORMAL LOW (ref 13.0–17.0)
MCH: 29.5 pg (ref 26.0–34.0)
MCHC: 34.2 g/dL (ref 30.0–36.0)
MCV: 86.4 fL (ref 80.0–100.0)
Platelets: 192 10*3/uL (ref 150–400)
RBC: 4.2 MIL/uL — ABNORMAL LOW (ref 4.22–5.81)
RDW: 13.4 % (ref 11.5–15.5)
WBC: 25.9 10*3/uL — ABNORMAL HIGH (ref 4.0–10.5)
nRBC: 0 % (ref 0.0–0.2)

## 2022-12-20 LAB — PREPARE PLATELET PHERESIS: Unit division: 0

## 2022-12-20 LAB — PREPARE CRYOPRECIPITATE: Unit division: 0

## 2022-12-20 LAB — BPAM FFP
Blood Product Expiration Date: 202404292359
Blood Product Expiration Date: 202404292359
ISSUE DATE / TIME: 202404252127
Unit Type and Rh: 6200
Unit Type and Rh: 6200

## 2022-12-20 LAB — BPAM CRYOPRECIPITATE
Blood Product Expiration Date: 202404292359
ISSUE DATE / TIME: 202404252225
Unit Type and Rh: 5100

## 2022-12-20 LAB — BPAM PLATELET PHERESIS: ISSUE DATE / TIME: 202404252222

## 2022-12-20 LAB — MRSA NEXT GEN BY PCR, NASAL: MRSA by PCR Next Gen: NOT DETECTED

## 2022-12-20 LAB — BPAM RBC
Blood Product Expiration Date: 202404272359
Blood Product Expiration Date: 202405272359
ISSUE DATE / TIME: 202404252115
ISSUE DATE / TIME: 202404252127
ISSUE DATE / TIME: 202404260329
Unit Type and Rh: 5100
Unit Type and Rh: 9500
Unit Type and Rh: 9500

## 2022-12-20 LAB — TYPE AND SCREEN: Unit division: 0

## 2022-12-20 LAB — HIV ANTIBODY (ROUTINE TESTING W REFLEX): HIV Screen 4th Generation wRfx: NONREACTIVE

## 2022-12-20 MED ORDER — HYDROMORPHONE HCL 1 MG/ML IJ SOLN
0.2500 mg | INTRAMUSCULAR | Status: DC | PRN
  Administered 2022-12-20 (×2): 0.5 mg via INTRAVENOUS

## 2022-12-20 MED ORDER — HYDROMORPHONE HCL 1 MG/ML IJ SOLN
INTRAMUSCULAR | Status: AC
  Filled 2022-12-20: qty 0.5

## 2022-12-20 MED ORDER — BISACODYL 5 MG PO TBEC
10.0000 mg | DELAYED_RELEASE_TABLET | Freq: Every day | ORAL | Status: DC
  Administered 2022-12-22: 10 mg via ORAL
  Filled 2022-12-20 (×4): qty 2

## 2022-12-20 MED ORDER — POTASSIUM CHLORIDE IN NACL 20-0.9 MEQ/L-% IV SOLN
INTRAVENOUS | Status: DC
  Filled 2022-12-20 (×3): qty 1000

## 2022-12-20 MED ORDER — OXYCODONE HCL 5 MG PO TABS: 5.0000 mg | ORAL_TABLET | ORAL | Status: DC | PRN

## 2022-12-20 MED ORDER — ONDANSETRON HCL 4 MG/2ML IJ SOLN
INTRAMUSCULAR | Status: DC | PRN
  Administered 2022-12-20: 4 mg via INTRAVENOUS

## 2022-12-20 MED ORDER — ONDANSETRON HCL 4 MG/2ML IJ SOLN: 4.0000 mg | Freq: Four times a day (QID) | INTRAMUSCULAR | Status: DC | PRN

## 2022-12-20 MED ORDER — MORPHINE SULFATE (PF) 2 MG/ML IV SOLN
1.0000 mg | INTRAVENOUS | Status: DC | PRN
  Administered 2022-12-20: 2 mg via INTRAVENOUS
  Filled 2022-12-20: qty 1

## 2022-12-20 MED ORDER — METHOCARBAMOL 1000 MG/10ML IJ SOLN: 500.0000 mg | Freq: Three times a day (TID) | INTRAVENOUS | Status: DC | PRN

## 2022-12-20 MED ORDER — HYDROMORPHONE HCL 1 MG/ML IJ SOLN
INTRAMUSCULAR | Status: AC
  Filled 2022-12-20: qty 1

## 2022-12-20 MED ORDER — SENNOSIDES-DOCUSATE SODIUM 8.6-50 MG PO TABS
1.0000 | ORAL_TABLET | Freq: Every day | ORAL | Status: DC
  Administered 2022-12-20 – 2022-12-22 (×3): 1 via ORAL
  Filled 2022-12-20 (×3): qty 1

## 2022-12-20 MED ORDER — OXYCODONE HCL 5 MG PO TABS: 2.5000 mg | ORAL_TABLET | ORAL | Status: DC | PRN

## 2022-12-20 MED ORDER — ACETAMINOPHEN 500 MG PO TABS
1000.0000 mg | ORAL_TABLET | Freq: Three times a day (TID) | ORAL | Status: DC
  Administered 2022-12-20 – 2022-12-23 (×10): 1000 mg via ORAL
  Filled 2022-12-20 (×10): qty 2

## 2022-12-20 MED ORDER — KETOROLAC TROMETHAMINE 15 MG/ML IJ SOLN
15.0000 mg | Freq: Four times a day (QID) | INTRAMUSCULAR | Status: AC
  Administered 2022-12-20 – 2022-12-22 (×8): 15 mg via INTRAVENOUS
  Filled 2022-12-20 (×8): qty 1

## 2022-12-20 MED ORDER — ACETAMINOPHEN 10 MG/ML IV SOLN
INTRAVENOUS | Status: DC | PRN
  Administered 2022-12-20: 1000 mg via INTRAVENOUS

## 2022-12-20 MED ORDER — ENOXAPARIN SODIUM 30 MG/0.3ML IJ SOSY
30.0000 mg | PREFILLED_SYRINGE | Freq: Two times a day (BID) | INTRAMUSCULAR | Status: DC
  Administered 2022-12-22 – 2022-12-23 (×3): 30 mg via SUBCUTANEOUS
  Filled 2022-12-20 (×3): qty 0.3

## 2022-12-20 MED ORDER — SODIUM CHLORIDE 0.9% FLUSH
10.0000 mL | Freq: Two times a day (BID) | INTRAVENOUS | Status: DC
  Administered 2022-12-20 – 2022-12-21 (×3): 10 mL

## 2022-12-20 MED ORDER — MORPHINE SULFATE (PF) 4 MG/ML IV SOLN
4.0000 mg | INTRAVENOUS | Status: DC | PRN
  Filled 2022-12-20: qty 1

## 2022-12-20 MED ORDER — ONDANSETRON 4 MG PO TBDP: 4.0000 mg | ORAL_TABLET | Freq: Four times a day (QID) | ORAL | Status: DC | PRN

## 2022-12-20 MED ORDER — METHOCARBAMOL 500 MG PO TABS: 500.0000 mg | ORAL_TABLET | Freq: Three times a day (TID) | ORAL | Status: DC | PRN

## 2022-12-20 MED ORDER — VANCOMYCIN HCL IN DEXTROSE 1-5 GM/200ML-% IV SOLN
1000.0000 mg | Freq: Two times a day (BID) | INTRAVENOUS | Status: AC
  Administered 2022-12-20: 1000 mg via INTRAVENOUS
  Filled 2022-12-20: qty 200

## 2022-12-20 MED ORDER — PANTOPRAZOLE SODIUM 40 MG IV SOLR: 40.0000 mg | Freq: Every day | INTRAVENOUS | Status: DC

## 2022-12-20 MED ORDER — SUGAMMADEX SODIUM 200 MG/2ML IV SOLN
INTRAVENOUS | Status: DC | PRN
  Administered 2022-12-20: 200 mg via INTRAVENOUS

## 2022-12-20 MED ORDER — KETAMINE HCL 50 MG/5ML IJ SOSY
PREFILLED_SYRINGE | INTRAMUSCULAR | Status: AC
  Filled 2022-12-20: qty 5

## 2022-12-20 MED ORDER — PANTOPRAZOLE SODIUM 40 MG PO TBEC
40.0000 mg | DELAYED_RELEASE_TABLET | Freq: Every day | ORAL | Status: DC
  Administered 2022-12-20 – 2022-12-21 (×2): 40 mg via ORAL
  Filled 2022-12-20: qty 1

## 2022-12-20 MED ORDER — OXYCODONE HCL 5 MG PO TABS: 5.0000 mg | ORAL_TABLET | Freq: Once | ORAL | Status: DC | PRN

## 2022-12-20 MED ORDER — ORAL CARE MOUTH RINSE: 15.0000 mL | OROMUCOSAL | Status: DC | PRN

## 2022-12-20 MED ORDER — CHLORHEXIDINE GLUCONATE CLOTH 2 % EX PADS
6.0000 | MEDICATED_PAD | Freq: Every day | CUTANEOUS | Status: DC
  Administered 2022-12-20: 6 via TOPICAL

## 2022-12-20 MED ORDER — DOCUSATE SODIUM 100 MG PO CAPS
100.0000 mg | ORAL_CAPSULE | Freq: Two times a day (BID) | ORAL | Status: DC
  Administered 2022-12-20 – 2022-12-22 (×6): 100 mg via ORAL
  Filled 2022-12-20 (×8): qty 1

## 2022-12-20 MED ORDER — PROMETHAZINE HCL 25 MG/ML IJ SOLN: 6.2500 mg | INTRAMUSCULAR | Status: DC | PRN

## 2022-12-20 MED ORDER — OXYCODONE HCL 5 MG/5ML PO SOLN: 5.0000 mg | Freq: Once | ORAL | Status: DC | PRN

## 2022-12-20 MED ORDER — METHOCARBAMOL 750 MG PO TABS
750.0000 mg | ORAL_TABLET | Freq: Three times a day (TID) | ORAL | Status: DC
  Administered 2022-12-20 – 2022-12-23 (×10): 750 mg via ORAL
  Filled 2022-12-20 (×8): qty 1
  Filled 2022-12-20 (×2): qty 2

## 2022-12-20 MED ORDER — OXYCODONE HCL 5 MG PO TABS
10.0000 mg | ORAL_TABLET | ORAL | Status: DC | PRN
  Administered 2022-12-20 – 2022-12-23 (×6): 10 mg via ORAL
  Filled 2022-12-20 (×9): qty 2

## 2022-12-20 MED ORDER — SODIUM CHLORIDE 0.9% FLUSH: 10.0000 mL | INTRAVENOUS | Status: DC | PRN

## 2022-12-20 MED ORDER — OXYCODONE HCL 5 MG PO TABS: 10.0000 mg | ORAL_TABLET | ORAL | Status: DC | PRN

## 2022-12-20 MED ORDER — OXYCODONE HCL 5 MG PO TABS
5.0000 mg | ORAL_TABLET | ORAL | Status: DC | PRN
  Administered 2022-12-20: 5 mg via ORAL
  Filled 2022-12-20 (×2): qty 1

## 2022-12-20 MED ORDER — PANTOPRAZOLE SODIUM 40 MG PO TBEC
40.0000 mg | DELAYED_RELEASE_TABLET | Freq: Every day | ORAL | Status: DC
  Administered 2022-12-22 – 2022-12-23 (×2): 40 mg via ORAL
  Filled 2022-12-20 (×3): qty 1
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
## 2022-12-20 NOTE — Progress Notes (Signed)
1 Day Post-Op Procedure(s) (LRB): THORACOTOMY MAJOR WITH WEDGE RESECTION OF RIGHT LUNG (Right) Subjective: C/o pain  Objective: Vital signs in last 24 hours: Temp:  [97.3 F (36.3 C)-98 F (36.7 C)] 98 F (36.7 C) (04/26 0130) Pulse Rate:  [65-102] 66 (04/26 0500) Cardiac Rhythm: Normal sinus rhythm (04/26 0400) Resp:  [14-37] 18 (04/26 0500) BP: (81-195)/(54-170) 114/77 (04/26 0500) SpO2:  [61 %-100 %] 100 % (04/26 0500) Arterial Line BP: (101-169)/(58-85) 141/65 (04/26 0500) Weight:  [76.2 kg] 76.2 kg (04/25 2148)  Hemodynamic parameters for last 24 hours:    Intake/Output from previous day: 04/25 0701 - 04/26 0700 In: 3591.3 [I.V.:2280; IV Piggyback:1311.3] Out: 3165 [Urine:1225; Blood:500; Chest Tube:1440] Intake/Output this shift: No intake/output data recorded.  General appearance: alert, cooperative, and mild distress Neurologic: intact Heart: regular rate and rhythm Lungs: clear to auscultation bilaterally  Lab Results: Recent Labs    12/19/22 2133 12/19/22 2315 12/20/22 0353  WBC 14.0*  --  25.9*  HGB 14.5 12.2* 12.4*  HCT 43.0 36.0* 36.3*  PLT 281  --  192   BMET:  Recent Labs    12/19/22 2133 12/19/22 2315 12/20/22 0353  NA 138 139 136  K 4.0 3.9 3.9  CL 104  --  103  CO2 22  --  25  GLUCOSE 134*  --  182*  BUN 13  --  10  CREATININE 1.09  --  0.91  CALCIUM 8.8*  --  8.3*    PT/INR:  Recent Labs    12/19/22 2133  LABPROT 15.4*  INR 1.2   ABG    Component Value Date/Time   PHART 7.365 12/19/2022 2315   HCO3 24.9 12/19/2022 2315   TCO2 26 12/19/2022 2315   ACIDBASEDEF 1.0 12/19/2022 2315   O2SAT 100 12/19/2022 2315   CBG (last 3)  No results for input(s): "GLUCAP" in the last 72 hours.  Assessment/Plan: S/P Procedure(s) (LRB): THORACOTOMY MAJOR WITH WEDGE RESECTION OF RIGHT LUNG (Right) POD # 1 Overall doing well Pain control is an issue- will add Toradol for 48 hours Ct to water seal Ambulate    LOS: 1 day    Loreli Slot 12/20/2022
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
## 2022-12-20 NOTE — Progress Notes (Signed)
Trauma Event Note    Rounding on pt for Cage Aid completion-- Sitting up in bed, pale, but denies any discomfort at this time.  Wife is with pt, though not in the room at this time.   Primary RN aware of TRN contact information.  Last imported Vital Signs BP 95/75   Pulse 93   Temp 100.3 F (37.9 C) (Oral)   Resp (!) 22   Ht 6\' 1"  (1.854 m)   Wt 168 lb (76.2 kg)   SpO2 92%   BMI 22.16 kg/m   Trending CBC Recent Labs    12/19/22 2133 12/19/22 2315 12/20/22 0353  WBC 14.0*  --  25.9*  HGB 14.5 12.2* 12.4*  HCT 43.0 36.0* 36.3*  PLT 281  --  192    Trending Coag's Recent Labs    12/19/22 2133  INR 1.2    Trending BMET Recent Labs    12/19/22 2119 12/19/22 2133 12/19/22 2315 12/20/22 0353  NA 140 138 139 136  K 4.2 4.0 3.9 3.9  CL 102 104  --  103  CO2  --  22  --  25  BUN 15 13  --  10  CREATININE 1.00 1.09  --  0.91  GLUCOSE 111* 134*  --  182*      Gabriel Reed M Gabriel Reed  Trauma Response RN  Please call TRN at [phone] for further assistance.

## 2022-12-20 NOTE — Progress Notes (Signed)
[employer] Detective Proffitt called for an update, requested to be updated on patient status once patient arrives to floor [phone].
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
## 2022-12-20 NOTE — Progress Notes (Signed)
Orthopedic Tech Progress Note Patient Details:  Gabriel Reed 1991/06/04 [id number]  Patient ID: [name] General, male   DOB: Jul 07, 1991, 32 y.o.   MRN: [id number] I attended trauma page. Trinna Post 12/20/2022, 1:44 AM
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
## 2022-12-20 NOTE — Progress Notes (Signed)
Patient from PACU to 2H17 on monitor.  VSS, patient following commands and nodding appropriately.  No family in waiting area.
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
## 2022-12-21 ENCOUNTER — Inpatient Hospital Stay (HOSPITAL_COMMUNITY): Payer: Medicaid Other

## 2022-12-21 LAB — CBC
HCT: 32.3 % — ABNORMAL LOW (ref 39.0–52.0)
Hemoglobin: 10.8 g/dL — ABNORMAL LOW (ref 13.0–17.0)
MCH: 29.3 pg (ref 26.0–34.0)
MCHC: 33.4 g/dL (ref 30.0–36.0)
MCV: 87.5 fL (ref 80.0–100.0)
Platelets: 178 10*3/uL (ref 150–400)
RBC: 3.69 MIL/uL — ABNORMAL LOW (ref 4.22–5.81)
RDW: 13.2 % (ref 11.5–15.5)
WBC: 16.1 10*3/uL — ABNORMAL HIGH (ref 4.0–10.5)
nRBC: 0 % (ref 0.0–0.2)

## 2022-12-21 LAB — BASIC METABOLIC PANEL
Anion gap: 7 (ref 5–15)
BUN: 10 mg/dL (ref 6–20)
CO2: 22 mmol/L (ref 22–32)
Calcium: 8.8 mg/dL — ABNORMAL LOW (ref 8.9–10.3)
Chloride: 111 mmol/L (ref 98–111)
Creatinine, Ser: 0.95 mg/dL (ref 0.61–1.24)
GFR, Estimated: >60 mL/min (ref >60)
Glucose, Bld: 110 mg/dL — ABNORMAL HIGH (ref 70–99)
Potassium: 3.9 mmol/L (ref 3.5–5.1)
Sodium: 140 mmol/L (ref 135–145)

## 2022-12-21 MED ORDER — NICOTINE 14 MG/24HR TD PT24
14.0000 mg | MEDICATED_PATCH | Freq: Every day | TRANSDERMAL | Status: DC
  Administered 2022-12-21 – 2022-12-23 (×3): 14 mg via TRANSDERMAL
  Filled 2022-12-21 (×3): qty 1
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
## 2022-12-21 NOTE — Progress Notes (Signed)
Pt spouse concerned that pt having anxiety and need nicotine patch.  MD notified.  New orders placed.
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
## 2022-12-21 NOTE — Progress Notes (Signed)
      [address]       [place] [address]             [phone]      2 Days Post-Op Procedure(s) (LRB): THORACOTOMY MAJOR WITH WEDGE RESECTION OF RIGHT LUNG (Right) Subjective:  Feeling better today. Out of bed in the bedside chair.  Denies shortness of breath,  pain controlled.   Objective: Vital signs in last 24 hours: Temp:  [98.2 F (36.8 C)-100.3 F (37.9 C)] 98.2 F (36.8 C) (04/27 0742) Pulse Rate:  [68-93] 79 (04/27 0742) Cardiac Rhythm: Normal sinus rhythm;Bundle branch block (04/27 0744) Resp:  [15-25] 15 (04/27 0742) BP: (95-115)/(66-77) 114/68 (04/27 0742) SpO2:  [90 %-98 %] 97 % (04/27 0742) Arterial Line BP: (104-143)/(52-88) 108/55 (04/26 1800)    Intake/Output from previous day: 04/26 0701 - 04/27 0700 In: 1199.7 [I.V.:1199.7] Out: 2705 [Urine:2625; Chest Tube:80] Intake/Output this shift: Total I/O In: -  Out: 20 [Chest Tube:20]  General appearance: alert, cooperative, and mild distress Neurologic: intact Heart: regular rate and rhythm Lungs: clear to auscultation bilaterally Wound: the thoracotomy incision dressing is dry. CT secure, no air leak. CT drainage 80ml past 24 hours. CXR stable with a tiny right apical PTX.  Lab Results: Recent Labs    12/20/22 0353 12/21/22 0611  WBC 25.9* 16.1*  HGB 12.4* 10.8*  HCT 36.3* 32.3*  PLT 192 178    BMET:  Recent Labs    12/20/22 0353 12/21/22 0611  NA 136 140  K 3.9 3.9  CL 103 111  CO2 25 22  GLUCOSE 182* 110*  BUN 10 10  CREATININE 0.91 0.95  CALCIUM 8.3* 8.8*     PT/INR:  Recent Labs    12/19/22 2133  LABPROT 15.4*  INR 1.2    ABG    Component Value Date/Time   PHART 7.365 12/19/2022 2315   HCO3 24.9 12/19/2022 2315   TCO2 26 12/19/2022 2315   ACIDBASEDEF 1.0 12/19/2022 2315   O2SAT 100 12/19/2022 2315   CBG (last 3)  No results for input(s): "GLUCAP" in the last 72 hours.  Assessment/Plan: S/P Procedure(s) (LRB): THORACOTOMY MAJOR WITH WEDGE  RESECTION OF RIGHT LUNG (Right) POD # 2 Continues to progress, pain control better.  Leave CT to water seal another 24 hours and remove in AM if CXR stable.  Ambulate    LOS: 2 days    Leary Roca, PA-C 12/21/2022

## 2022-12-21 NOTE — Progress Notes (Signed)
OT Cancellation Note  Patient Details Name: Sayid Moll MRN: [id number] DOB: 11/21/1990   Cancelled Treatment:    Reason Eval/Treat Not Completed: Patient at procedure or test/ unavailable (Having central line pulled.Will return later time.)  [facility] 12/21/2022, 10:56 AM Luisa Dago, OT/L   Acute OT Clinical Specialist Acute Rehabilitation Services Pager [phone] Office [phone]

## 2022-12-22 ENCOUNTER — Inpatient Hospital Stay (HOSPITAL_COMMUNITY): Payer: Medicaid Other

## 2022-12-22 DIAGNOSIS — W3400XA Accidental discharge from unspecified firearms or gun, initial encounter: Secondary | ICD-10-CM | POA: Diagnosis not present

## 2022-12-22 DIAGNOSIS — F4322 Adjustment disorder with anxiety: Secondary | ICD-10-CM

## 2022-12-22 DIAGNOSIS — F4310 Post-traumatic stress disorder, unspecified: Secondary | ICD-10-CM

## 2022-12-22 LAB — COMPREHENSIVE METABOLIC PANEL
ALT: 14 U/L (ref 0–44)
AST: 21 U/L (ref 15–41)
Albumin: 2.9 g/dL — ABNORMAL LOW (ref 3.5–5.0)
Alkaline Phosphatase: 44 U/L (ref 38–126)
Anion gap: 9 (ref 5–15)
BUN: 14 mg/dL (ref 6–20)
CO2: 24 mmol/L (ref 22–32)
Calcium: 8.7 mg/dL — ABNORMAL LOW (ref 8.9–10.3)
Chloride: 108 mmol/L (ref 98–111)
Creatinine, Ser: 0.84 mg/dL (ref 0.61–1.24)
GFR, Estimated: >60 mL/min (ref >60)
Glucose, Bld: 100 mg/dL — ABNORMAL HIGH (ref 70–99)
Potassium: 3.7 mmol/L (ref 3.5–5.1)
Sodium: 141 mmol/L (ref 135–145)
Total Bilirubin: 0.9 mg/dL (ref 0.3–1.2)
Total Protein: 5 g/dL — ABNORMAL LOW (ref 6.5–8.1)

## 2022-12-22 LAB — CBC
HCT: 28 % — ABNORMAL LOW (ref 39.0–52.0)
Hemoglobin: 9.3 g/dL — ABNORMAL LOW (ref 13.0–17.0)
MCH: 29.7 pg (ref 26.0–34.0)
MCHC: 33.2 g/dL (ref 30.0–36.0)
MCV: 89.5 fL (ref 80.0–100.0)
Platelets: 142 10*3/uL — ABNORMAL LOW (ref 150–400)
RBC: 3.13 MIL/uL — ABNORMAL LOW (ref 4.22–5.81)
RDW: 13.2 % (ref 11.5–15.5)
WBC: 9.5 10*3/uL (ref 4.0–10.5)
nRBC: 0 % (ref 0.0–0.2)

## 2022-12-22 MED ORDER — FLUOXETINE HCL 10 MG PO CAPS
10.0000 mg | ORAL_CAPSULE | Freq: Every day | ORAL | Status: DC
  Administered 2022-12-22 – 2022-12-23 (×2): 10 mg via ORAL
  Filled 2022-12-22 (×2): qty 1
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
## 2022-12-22 NOTE — Progress Notes (Signed)
      [address]       [place] [address]             [phone]      3 Days Post-Op Procedure(s) (LRB): THORACOTOMY MAJOR WITH WEDGE RESECTION OF RIGHT LUNG (Right) Subjective:  Up in the bedside chair says he feels OK, pain controlled.    Objective: Vital signs in last 24 hours: Temp:  [98.1 F (36.7 C)-98.6 F (37 C)] 98.6 F (37 C) (04/27 2320) Pulse Rate:  [68-106] 77 (04/27 2320) Cardiac Rhythm: Normal sinus rhythm (04/27 1900) Resp:  [12-23] 18 (04/27 2320) BP: (98-113)/(67-74) 106/74 (04/27 2320) SpO2:  [93 %-99 %] 97 % (04/27 2320)    Intake/Output from previous day: 04/27 0701 - 04/28 0700 In: 240 [P.O.:240] Out: 470 [Urine:350; Chest Tube:120] Intake/Output this shift: No intake/output data recorded.  General appearance: alert, cooperative, and mild distress Neurologic: intact Heart: regular rate and rhythm Lungs: breath sounds coarse, stable sats on RA with normal WOB. Wound: the thoracotomy incision dressing is dry. CT secure, no air leak. CT drainage 50ml past 12 hours. CXR stable with a tiny right apical PTX.  Lab Results: Recent Labs    12/21/22 0611 12/22/22 0035  WBC 16.1* 9.5  HGB 10.8* 9.3*  HCT 32.3* 28.0*  PLT 178 142*    BMET:  Recent Labs    12/21/22 0611 12/22/22 0035  NA 140 141  K 3.9 3.7  CL 111 108  CO2 22 24  GLUCOSE 110* 100*  BUN 10 14  CREATININE 0.95 0.84  CALCIUM 8.8* 8.7*     PT/INR:  Recent Labs    12/19/22 2133  LABPROT 15.4*  INR 1.2    ABG    Component Value Date/Time   PHART 7.365 12/19/2022 2315   HCO3 24.9 12/19/2022 2315   TCO2 26 12/19/2022 2315   ACIDBASEDEF 1.0 12/19/2022 2315   O2SAT 100 12/19/2022 2315   CBG (last 3)  No results for input(s): "GLUCAP" in the last 72 hours.  Assessment/Plan: S/P Procedure(s) (LRB): THORACOTOMY MAJOR WITH WEDGE RESECTION OF RIGHT LUNG (Right) POD # 3 Continues to progress, reasonable pain control.  Remove the CT tube today,  follow up CXR later today and in AM.    LOS: 3 days    Leary Roca, PA-C 12/22/2022

## 2022-12-23 ENCOUNTER — Other Ambulatory Visit (HOSPITAL_COMMUNITY): Payer: Self-pay

## 2022-12-23 ENCOUNTER — Inpatient Hospital Stay (HOSPITAL_COMMUNITY): Payer: Medicaid Other

## 2022-12-23 ENCOUNTER — Encounter (HOSPITAL_COMMUNITY): Payer: Self-pay

## 2022-12-23 LAB — TYPE AND SCREEN
ABO/RH(D): O NEG
Antibody Screen: NEGATIVE
Unit division: 0

## 2022-12-23 LAB — BPAM RBC
Blood Product Expiration Date: 202404302359
Blood Product Expiration Date: 202405272359
Blood Product Expiration Date: 202405282359
Blood Product Expiration Date: 202405282359
ISSUE DATE / TIME: 202404241530
ISSUE DATE / TIME: 202404252127
Unit Type and Rh: 5100
Unit Type and Rh: 5100

## 2022-12-23 LAB — CBC
HCT: 29.7 % — ABNORMAL LOW (ref 39.0–52.0)
Hemoglobin: 10.3 g/dL — ABNORMAL LOW (ref 13.0–17.0)
MCH: 29.9 pg (ref 26.0–34.0)
MCHC: 34.7 g/dL (ref 30.0–36.0)
MCV: 86.1 fL (ref 80.0–100.0)
Platelets: 182 10*3/uL (ref 150–400)
RBC: 3.45 MIL/uL — ABNORMAL LOW (ref 4.22–5.81)
RDW: 12.9 % (ref 11.5–15.5)
WBC: 8.8 10*3/uL (ref 4.0–10.5)
nRBC: 0 % (ref 0.0–0.2)

## 2022-12-23 LAB — SURGICAL PATHOLOGY

## 2022-12-23 MED ORDER — NICOTINE 14 MG/24HR TD PT24
14.0000 mg | MEDICATED_PATCH | Freq: Every day | TRANSDERMAL | 0 refills | Status: AC
  Filled 2022-12-23: qty 28, 28d supply, fill #0

## 2022-12-23 MED ORDER — ACETAMINOPHEN 500 MG PO TABS: 1000.0000 mg | ORAL_TABLET | Freq: Three times a day (TID) | ORAL | Status: AC | PRN

## 2022-12-23 MED ORDER — OXYCODONE HCL 10 MG PO TABS
5.0000 mg | ORAL_TABLET | ORAL | 0 refills | Status: AC | PRN
  Filled 2022-12-23: qty 30, 5d supply, fill #0

## 2022-12-23 MED ORDER — FLUOXETINE HCL 10 MG PO CAPS
10.0000 mg | ORAL_CAPSULE | Freq: Every day | ORAL | 0 refills | Status: AC
  Filled 2022-12-23: qty 30, 30d supply, fill #0

## 2022-12-23 MED ORDER — METHOCARBAMOL 750 MG PO TABS
750.0000 mg | ORAL_TABLET | Freq: Three times a day (TID) | ORAL | 0 refills | Status: DC | PRN
  Filled 2022-12-23: qty 30, 10d supply, fill #0

## 2022-12-23 NOTE — Progress Notes (Signed)
Pt discharged home with wife. 2 PIV's removed, pt belongings packed, as well as gauze and tape for wound care over thoracotomy site. TOC meds delivered to room and pt and wife educated on all discharge instructions with no questions at this time.   Robina Ade, RN
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
## 2022-12-23 NOTE — Progress Notes (Addendum)
      [address]       [place] [address]             [phone]      4 Days Post-Op Procedure(s) (LRB): THORACOTOMY MAJOR WITH WEDGE RESECTION OF RIGHT LUNG (Right) Subjective:  Doing well, ready to go home  Objective: Vital signs in last 24 hours: Temp:  [97.9 F (36.6 C)-98.7 F (37.1 C)] 97.9 F (36.6 C) (04/29 0300) Pulse Rate:  [63-85] 63 (04/29 0300) Cardiac Rhythm: Normal sinus rhythm (04/28 1900) Resp:  [14-17] 15 (04/29 0300) BP: (103-115)/(66-90) 112/79 (04/29 0300) SpO2:  [92 %-99 %] 95 % (04/29 0300)    Intake/Output from previous day: 04/28 0701 - 04/29 0700 In: 240 [P.O.:240] Out: 400 [Urine:400] Intake/Output this shift: No intake/output data recorded.  General appearance: alert, cooperative, and mild distress Neurologic: intact Heart: regular rate and rhythm Lungs: clear to auscultation bilaterally Wound: the thoracotomy incision dressing is dry. CXR stable with a tiny right apical PTX.  Lab Results: Recent Labs    12/22/22 0035 12/23/22 0218  WBC 9.5 8.8  HGB 9.3* 10.3*  HCT 28.0* 29.7*  PLT 142* 182    BMET:  Recent Labs    12/21/22 0611 12/22/22 0035  NA 140 141  K 3.9 3.7  CL 111 108  CO2 22 24  GLUCOSE 110* 100*  BUN 10 14  CREATININE 0.95 0.84  CALCIUM 8.8* 8.7*     PT/INR:  No results for input(s): "LABPROT", "INR" in the last 72 hours.  ABG    Component Value Date/Time   PHART 7.365 12/19/2022 2315   HCO3 24.9 12/19/2022 2315   TCO2 26 12/19/2022 2315   ACIDBASEDEF 1.0 12/19/2022 2315   O2SAT 100 12/19/2022 2315   CBG (last 3)  No results for input(s): "GLUCAP" in the last 72 hours.  Assessment/Plan: S/P Procedure(s) (LRB): THORACOTOMY MAJOR WITH WEDGE RESECTION OF RIGHT LUNG (Right)  Doing well, CXR with tiny apical pneumo, stable Ok to d/c from thoracic surgery perspective    LOS: 4 days   Kerin Salen MD [facility] 12/23/2022

## 2022-12-23 NOTE — Discharge Instructions (Signed)
Discharge Instructions:  1. You may shower, please wash incisions daily with soap and water and keep dry.  If you wish to cover wounds with dressing you may do so but please keep clean and change daily.  No tub baths or swimming until incisions have completely healed.  If your incisions become red or develop any drainage please call our office at [phone]  2. Activity- up as tolerated, please walk at least 3 times per day.  Avoid strenuous activity, no lifting, pushing, or pulling with your arms over 8-10 lbs for a minimum of 6 weeks  3. If any questions or concerns arise, please do not hesitate to contact our office at [phone]

## 2022-12-23 NOTE — TOC Transition Note (Signed)
Transition of Care [facility]) - CM/SW Discharge Note   Patient Details  Name: Gabriel Reed MRN: [id number] Date of Birth: 23-Oct-1990  [facility] CM/SW Contact:  Glennon Mac, RN Phone Number: 12/23/2022, 12:08 PM   Clinical Narrative:    Patient medically stable for discharge home with spouse.  PT/OT recommending no OP follow up or DME.  Patient states his spouse is able to provide needed assistance at dc.     Final next level of care: Home/Self Care Barriers to Discharge: Barriers Resolved                         Discharge Plan and Services Additional resources added to the After Visit Summary for     Discharge Planning Services: CM Consult                                 Social Determinants of Health (SDOH) Interventions     Readmission Risk Interventions     No data to display          Quintella Baton, RN, BSN  Trauma/Neuro ICU Case Manager [phone]
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
## 2022-12-31 ENCOUNTER — Other Ambulatory Visit: Payer: Self-pay | Admitting: Thoracic Surgery (Cardiothoracic Vascular Surgery)

## 2022-12-31 DIAGNOSIS — W3400XS Accidental discharge from unspecified firearms or gun, sequela: Secondary | ICD-10-CM
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
## 2023-01-07 ENCOUNTER — Other Ambulatory Visit: Payer: Self-pay

## 2023-01-07 ENCOUNTER — Ambulatory Visit (INDEPENDENT_AMBULATORY_CARE_PROVIDER_SITE_OTHER): Payer: Medicaid Other | Admitting: Physician Assistant

## 2023-01-07 ENCOUNTER — Ambulatory Visit
Admission: RE | Admit: 2023-01-07 | Discharge: 2023-01-07 | Disposition: A | Discharge status: Home / Self Care | Payer: Medicaid Other | Source: Ambulatory Visit | Attending: Thoracic Surgery (Cardiothoracic Vascular Surgery) | Admitting: Thoracic Surgery (Cardiothoracic Vascular Surgery)

## 2023-01-07 VITALS — BP 111/75 | HR 62 | Resp 20 | Ht 73.0 in | Wt 145.5 lb

## 2023-01-07 DIAGNOSIS — W3400XS Accidental discharge from unspecified firearms or gun, sequela: Secondary | ICD-10-CM

## 2023-01-07 DIAGNOSIS — Z9889 Other specified postprocedural states: Secondary | ICD-10-CM

## 2023-01-07 MED ORDER — METHOCARBAMOL 750 MG PO TABS
750.0000 mg | ORAL_TABLET | Freq: Three times a day (TID) | ORAL | 0 refills | Status: AC | PRN
  Filled 2023-01-07: qty 30, 10d supply, fill #0

## 2023-01-07 MED ORDER — OXYCODONE HCL 5 MG PO TABS
5.0000 mg | ORAL_TABLET | Freq: Four times a day (QID) | ORAL | 0 refills | Status: AC | PRN
  Filled 2023-01-07: qty 40, 10d supply, fill #0
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
## 2023-01-24 ENCOUNTER — Inpatient Hospital Stay: Payer: Medicaid Other | Admitting: Family

## 2023-01-24 DIAGNOSIS — Z7689 Persons encountering health services in other specified circumstances: Secondary | ICD-10-CM

## 2023-02-21 ENCOUNTER — Encounter (HOSPITAL_COMMUNITY): Payer: Self-pay

## 2023-02-21 ENCOUNTER — Other Ambulatory Visit: Payer: Self-pay

## 2023-02-21 ENCOUNTER — Emergency Department (HOSPITAL_COMMUNITY): Payer: Medicaid Other

## 2023-02-21 ENCOUNTER — Emergency Department (HOSPITAL_COMMUNITY)
Admission: EM | Admit: 2023-02-21 | Discharge: 2023-02-21 | Discharge status: Against Medical Advice | Payer: Medicaid Other | Attending: Emergency Medicine | Admitting: Emergency Medicine

## 2023-02-21 DIAGNOSIS — R0602 Shortness of breath: Secondary | ICD-10-CM | POA: Insufficient documentation

## 2023-02-21 DIAGNOSIS — R001 Bradycardia, unspecified: Secondary | ICD-10-CM | POA: Diagnosis not present

## 2023-02-21 HISTORY — DX: Accidental discharge from unspecified firearms or gun, initial encounter: W34.00XA

## 2023-02-21 LAB — COMPREHENSIVE METABOLIC PANEL
ALT: 11 U/L (ref 0–44)
AST: 18 U/L (ref 15–41)
Albumin: 3.4 g/dL — ABNORMAL LOW (ref 3.5–5.0)
Alkaline Phosphatase: 90 U/L (ref 38–126)
Anion gap: 14 (ref 5–15)
BUN: 9 mg/dL (ref 6–20)
CO2: 21 mmol/L — ABNORMAL LOW (ref 22–32)
Calcium: 8.8 mg/dL — ABNORMAL LOW (ref 8.9–10.3)
Chloride: 106 mmol/L (ref 98–111)
Creatinine, Ser: 0.73 mg/dL (ref 0.61–1.24)
GFR, Estimated: >60 mL/min (ref >60)
Glucose, Bld: 82 mg/dL (ref 70–99)
Potassium: 4.1 mmol/L (ref 3.5–5.1)
Sodium: 141 mmol/L (ref 135–145)
Total Bilirubin: 0.9 mg/dL (ref 0.3–1.2)
Total Protein: 5.8 g/dL — ABNORMAL LOW (ref 6.5–8.1)

## 2023-02-21 LAB — CBC WITH DIFFERENTIAL/PLATELET
Abs Immature Granulocytes: 0.03 10*3/uL (ref 0.00–0.07)
Basophils Absolute: 0.1 10*3/uL (ref 0.0–0.1)
Basophils Relative: 1 %
Eosinophils Absolute: 0.2 10*3/uL (ref 0.0–0.5)
Eosinophils Relative: 3 %
HCT: 42.7 % (ref 39.0–52.0)
Hemoglobin: 13.9 g/dL (ref 13.0–17.0)
Immature Granulocytes: 0 %
Lymphocytes Relative: 23 %
Lymphs Abs: 2 10*3/uL (ref 0.7–4.0)
MCH: 28.7 pg (ref 26.0–34.0)
MCHC: 32.6 g/dL (ref 30.0–36.0)
MCV: 88 fL (ref 80.0–100.0)
Monocytes Absolute: 0.7 10*3/uL (ref 0.1–1.0)
Monocytes Relative: 8 %
Neutro Abs: 5.5 10*3/uL (ref 1.7–7.7)
Neutrophils Relative %: 65 %
Platelets: 233 10*3/uL (ref 150–400)
RBC: 4.85 MIL/uL (ref 4.22–5.81)
RDW: 12.5 % (ref 11.5–15.5)
WBC: 8.5 10*3/uL (ref 4.0–10.5)
nRBC: 0 % (ref 0.0–0.2)
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
## 2023-02-21 NOTE — Discharge Instructions (Signed)
Workup here today to include labs chest x-ray EKG without any acute findings.  Just follow-up with cardiothoracic surgery as scheduled.  Return for any new or worse symptoms.

## 2023-02-21 NOTE — ED Notes (Signed)
Per NT patient stated that there is "nothing wrong", and is leaving. Patient ambulated independently from ED at that time.

## 2023-08-08 ENCOUNTER — Ambulatory Visit: Payer: Self-pay

## 2023-08-08 ENCOUNTER — Telehealth: Payer: Medicaid Other | Admitting: Physician Assistant

## 2023-08-08 DIAGNOSIS — W3400XA Accidental discharge from unspecified firearms or gun, initial encounter: Secondary | ICD-10-CM

## 2023-08-08 DIAGNOSIS — G8929 Other chronic pain: Secondary | ICD-10-CM | POA: Diagnosis not present

## 2023-08-08 DIAGNOSIS — M546 Pain in thoracic spine: Secondary | ICD-10-CM

## 2023-08-08 MED ORDER — GABAPENTIN 300 MG PO CAPS
300.0000 mg | ORAL_CAPSULE | Freq: Every day | ORAL | 2 refills | Status: DC
Start: 2023-08-08 — End: 2023-11-10
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
## 2023-08-08 NOTE — Patient Instructions (Signed)
Gabriel Reed, thank you for joining Margaretann Loveless, PA-C for today's virtual visit.  While this provider is not your primary care provider (PCP), if your PCP is located in our provider database this encounter information will be shared with them immediately following your visit.   A [facility] MyChart account gives you access to today's visit and all your visits, tests, and labs performed at [facility] " click here if you don't have a [place] MyChart account or go to [email or web address]  Consent: (Patient) Gabriel Reed provided verbal consent for this virtual visit at the beginning of the encounter.  Current Medications:  Current Outpatient Medications:    gabapentin (NEURONTIN) 300 MG capsule, Take 1 capsule (300 mg total) by mouth daily., Disp: 30 capsule, Rfl: 2   acetaminophen (TYLENOL) 500 MG tablet, Take 2 tablets (1,000 mg total) by mouth every 8 (eight) hours as needed for mild pain or fever., Disp: , Rfl:    FLUoxetine (PROZAC) 10 MG capsule, Take 1 capsule (10 mg total) by mouth daily., Disp: 30 capsule, Rfl: 0   ibuprofen (ADVIL) 200 MG tablet, Take 400 mg by mouth daily as needed for moderate pain or headache., Disp: , Rfl:    methocarbamol (ROBAXIN) 750 MG tablet, Take 1 tablet (750 mg total) by mouth every 8 (eight) hours as needed for muscle spasms., Disp: 30 tablet, Rfl: 0   oxyCODONE (OXY IR/ROXICODONE) 5 MG immediate release tablet, Take 1 tablet (5 mg total) by mouth every 6 (six) hours as needed for severe pain., Disp: 40 tablet, Rfl: 0   Oxycodone HCl 10 MG TABS, Take 0.5-1 tablets (5-10 mg total) by mouth every 4 (four) hours as needed for severe pain., Disp: 30 tablet, Rfl: 0   Medications ordered in this encounter:  Meds ordered this encounter  Medications   gabapentin (NEURONTIN) 300 MG capsule    Sig: Take 1 capsule (300 mg total) by mouth daily.    Dispense:  30 capsule    Refill:  2    Supervising Provider:   Merrilee Jansky  [phone]     *If you need refills on other medications prior to your next appointment, please contact your pharmacy*  Follow-Up: Call back or seek an in-person evaluation if the symptoms worsen or if the condition fails to improve as anticipated.  [facility] [phone]  Other Instructions Mindfulness Training/Deep Breathing/Guided Meditations: - Consider downloading one of the following apps as they each deal with walking you through these techniques -- Calm, HeadSpace, [place] - There are also a lot of great books on these subjects. Here are a few good ones to consider:           * Mindfulness for Beginners by Charlene Brooke           * Practicing Mindfulness by Trisha Mangle           * Breath WORK by Gene Smithson   National Suicide Prevention Lifeline 4243857052 9402962685) OffParking.fi "988"-Mental Health Crisis [facility] 601 281 6185 nimhinfo@nih .gov (e-mail) http://www.maynard.net/  Local Resources: Outpatient:   [facility] Urgent Care:  [phone] [address]   [place], [place]  [address]   https://[email or web address]-smith.[email or web address]  [facility] [address] [place], [place] [address] [phone]  [facility]  Outpatient Behavioral Health at [facility] [address] [employer] [place], [place]  [address] [phone] Local Resources (Inpatient)  [facility]  [address]  [place], [place] [address] [phone]  [facility] Medicine Unit and Geriatric Psychiatric Unit   [address]  [place], [place] [address] [phone]   NAMI [facility] http://[email or web address]/  NAMI [facility] https://[email or web address].org/  Affiliated Computer Services and Support Resources:  Partners Ending Homelessness DesireBlog.pl  Loss adjuster, chartered https://www.interactiveresourcecenter.org/  [employer] https://www.greensborourbanministry.org/  [facility] [employer] (adult [employer] shelter) www.opendoorministrieshp.org [address] [place], [place] [address] [phone] [employer] of [employer] and Center of [facility] [phone] [address] [place], [place] [address] [phone]   [facility] [phone] VET [phone])  [facility] [phone] press 1 Confidential chat-VeteransCrisisLine.net Or Text to [phone]  [facility] Call [phone] or [phone] www.http://[email or web address]-[email or web address]/  Affordable Housing Resources in [place] nchousingsearch.com   9133148266  [facility] Housing Hotline (747)112-6574 8:30am-5:30pm Caring Services - Vet Safety Net [address] [place], [place]  [address] [phone] Male veterans 18+ with substance abuse issues Eligibility:  By Referral Only  [facility] [address] [place], [place] [address] [phone] Ext. 47 Adult Men & Women Eligibility: Valid ID & Social Engineer, drilling.greensborourbanministry.[facility]  Caring Services - Vet Safety Net [address] [place], [place]  [address] [phone] Male veterans 18+ with substance abuse issues Eligibility:  By Referral Only  [facility] [address] [place], [place]  [address] [phone] Single women 18+ without dependents Open 6pm-8am Eligibility:  Valid ID & Social Security Card Call to check availability  http://westendministries.org/leslieshouse.aspx  [facility] [address] [place], [place]  [address] [phone] Male veterans 18+ with substance abuse/mental health issues Eligibility:  By Referral Only  [facility]  [facility] [address] [place], [place] [address] [phone] Call to check availability Males 18+ Eligibility: Valid ID & Social Security Card www.odm-hp.[facility] of [employer] [address] [place], [place] [address] [phone] Women 18+ & Families with children Eligibility:  Valid ID & Criminal Background Check https://gomez.info/     [facility] [place] (Care Management): [phone] [facility] Behavioral Health 24 Hour Phone Line [phone] NAMI Hotline [phone] [facility] [phone]  2-1-1 Referral Service [facility] 211 Call 211 or [phone] www.http://[email or web address]-[email or web address]/ A free [employer], 24/7 telephone information and referral service to help link citizens who are seeking help with the community resources they need. In [place], [place], [place] and [place] counties, just dial 211 from your phone.  Mental Health Association in [place] [phone] Support groups for anxiety, depression and bipolar disorder, schizophrenia, family and friends, aftermath of suicide, and mental wellness for Latinos (in [place]).  Mental Health Association in [facility] [phone] Offers support groups, [employer] program offers. Psychological, vocational, educational and other rehabilitation services to those who suffer from mental health illness of the 72 and up (5 days a week/5hours a day), offer out patient services like diagnostic evaluation, comprehensive clinical assessments, individual counseling, group therapy, psycho-educational workshops, referral to other specialists, referral to a psychiatrist for an evaluation for medication, consultation, and outreach/training.  ADS (Alcohol and Drug Services) [phone] (719)[phone] Substance Abuse education, prevention and treatment (detox, assessments, intensive outpatient and inpatient counseling and programs).  [facility] [place] [phone],  HP [phone] Support groups for posttraumatic stress, depression, and schizophrenia? as well as day programs for individuals with severe mental illness.  [facility] [place] [phone]  [facility] House-[phone]  [employer] [phone] or www.kellinfoundation.org Telehealth platform, Individual counseling across the lifespan for both mental health and substance use, support groups, advocacy, case management, virtual villages, resource coordination.  [phone]  [facility]-FREE-(434)[phone] or [phone]. Provides mental health services to all residents regardless of ability to pay. [address] [place]. [place]. 24  Domestic Violence Crisis Line Family Service of the [employer] for shelter and/or Chief of Staff [employer] [phone] (24/7) [place] [phone] (24/7)  VA Homeless Hotline [phone]  [facility] [phone] press 1 Confidential chat-VeteransCrisisLine.net Or Text to [phone]  [facility] [address] [place], [place] [address] [phone] Hours: Mon-Fri. 8am-5pm [facility] Mobile Crisis Management Mobile crisis response for mental health, substance abuse or intellectual/developmental disabilities [phone]  Disclaimer: This resource list is subject to change at any time and is a starting point for resource identification as of 08/24/2021.     Take Care!   If you have been instructed to have an in-person evaluation today at a local Urgent Care facility, please use the link below. It will take you to a list of all of our available [facility], including address, phone number and hours of operation. Please do not delay care.  [facility]  If you or a family member do not have a primary care provider, use the link below to schedule a visit and establish care. When you  choose a [facility] primary care physician or advanced practice provider, you gain a long-term partner in health. Find a Primary Care Provider  Learn more about [facility]'s in-office and virtual care options: [place] - Get Care Now
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
## 2023-08-14 IMAGING — CT CT L SPINE W/O CM
3 of 4 series · 11 of 33 positions shown, 13 images · non-contrast
Comparison: None.

CLINICAL DATA: Sharp back pain.  Possible syncope

EXAM:
CT LUMBAR SPINE WITHOUT CONTRAST
TECHNIQUE: Multidetector CT imaging of the lumbar spine was performed without
intravenous contrast administration. Multiplanar CT image
reconstructions were also generated.

[Series 4: l spine st · axial · 0.31mm/px · z∈[+1254,+1422]mm · 3 of 126 slices shown, 4 images]
[im 21/126  soft-tissue]
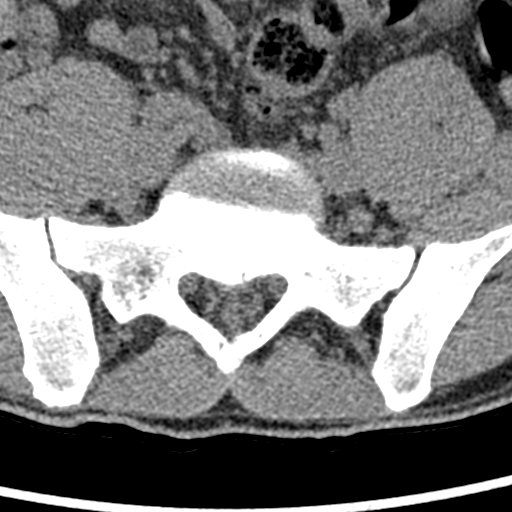
[im 21/126  bone]
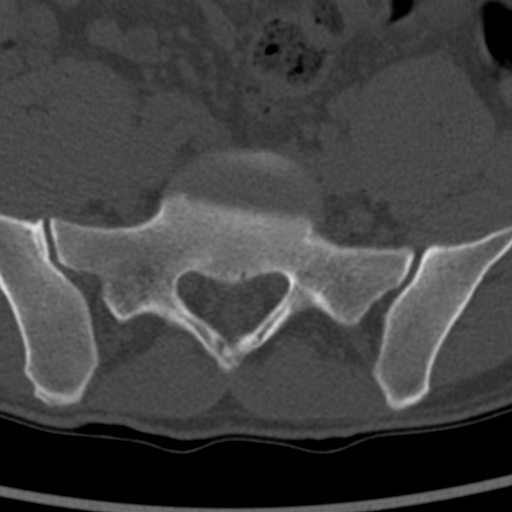
[im 63/126  bone]
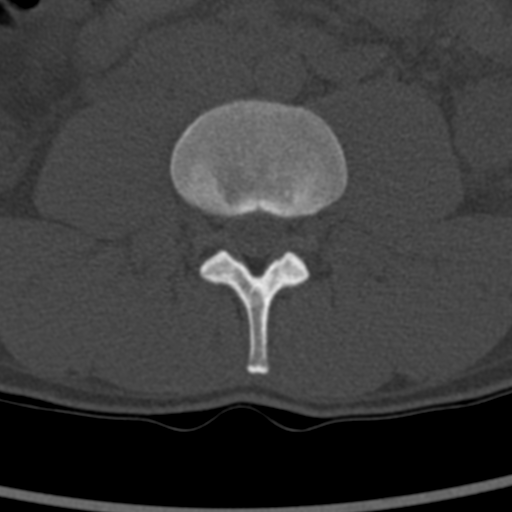
[im 105/126  bone]
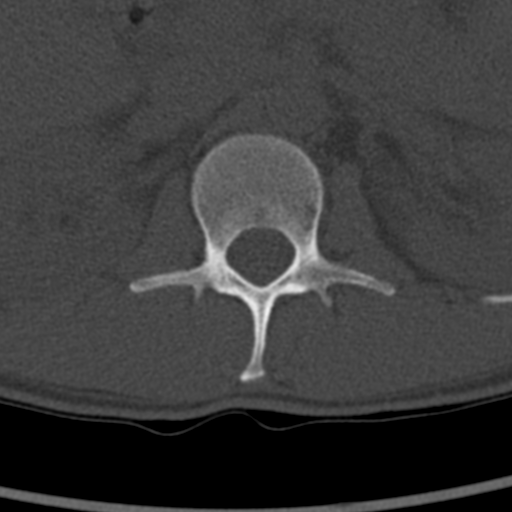

[Series 10: sagittal st · sagittal · 0.26mm/px · 5 of 70 slices shown, 6 images]
[im 24/70  bone]
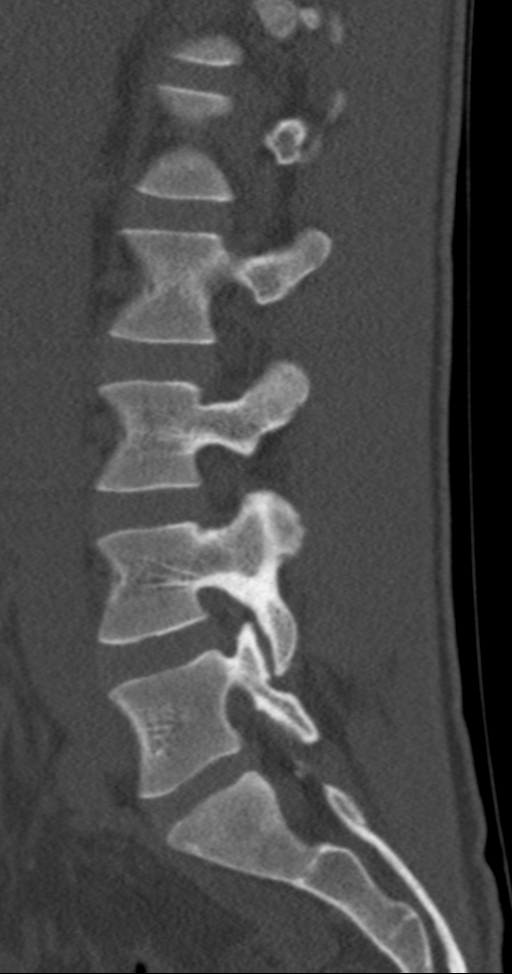
[im 29/70  bone]
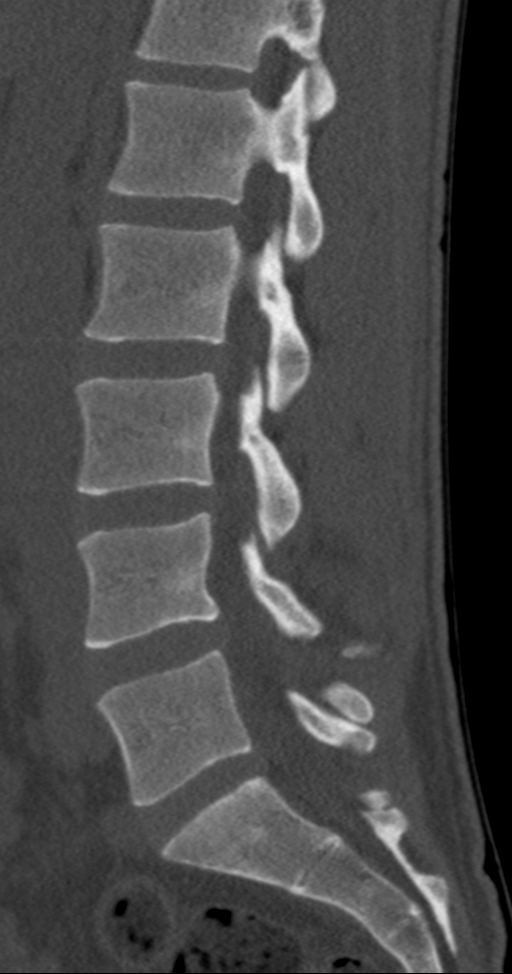
[im 35/70  soft-tissue]
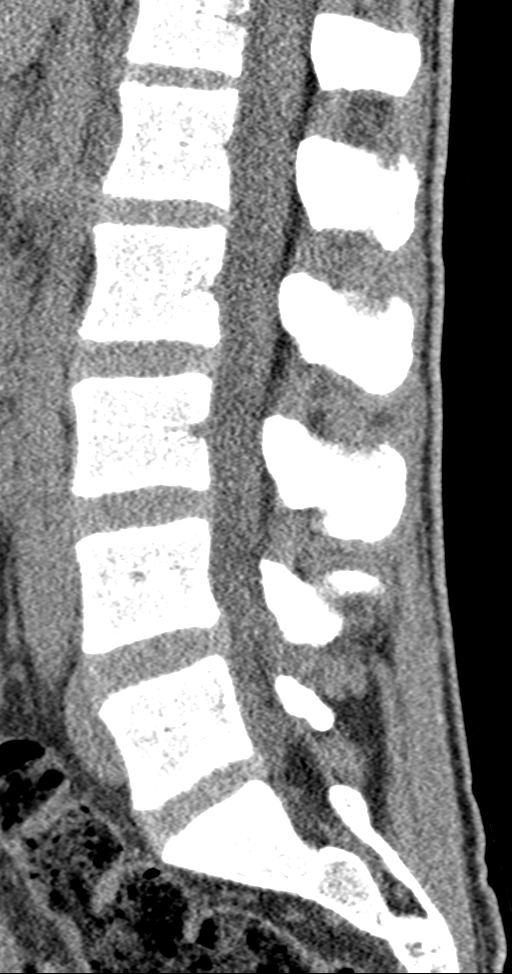
[im 35/70  bone]
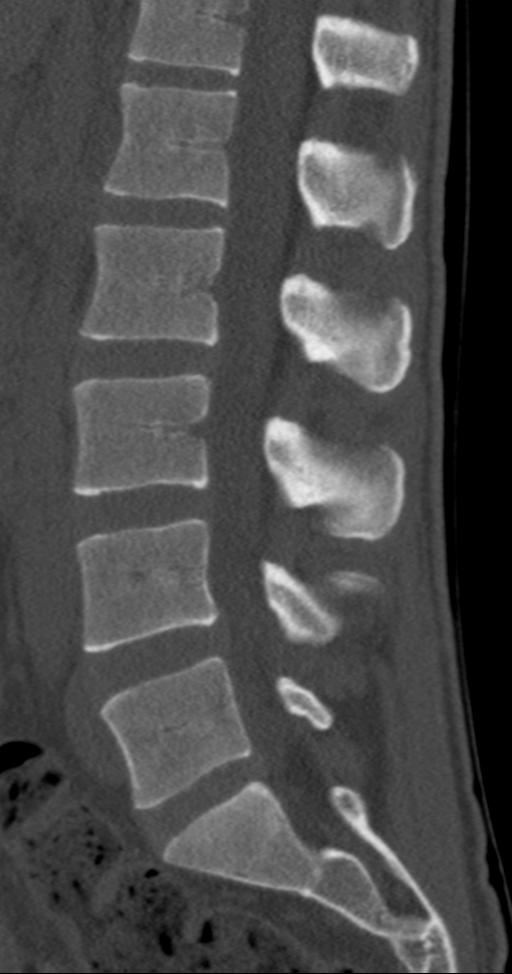
[im 41/70  bone]
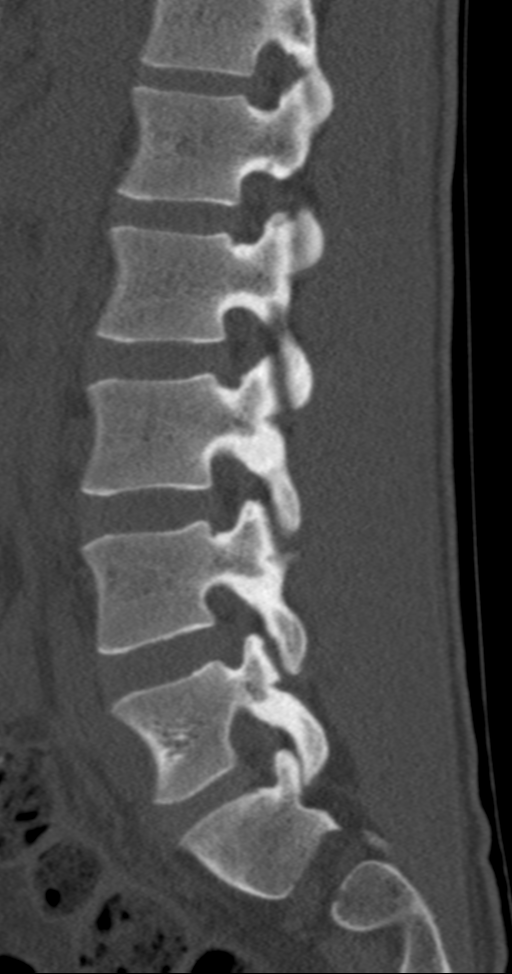
[im 47/70  bone]
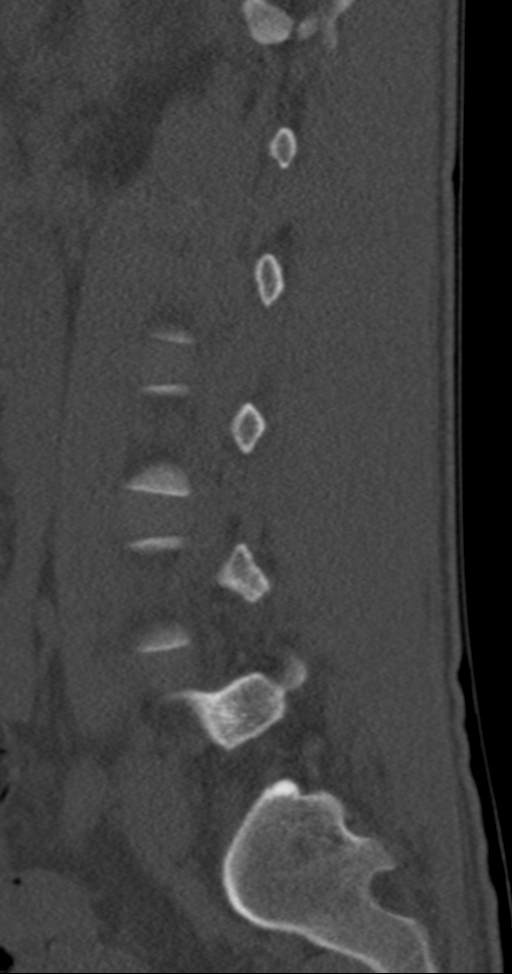

[Series 11: coronal st · coronal · 0.28mm/px · 3 of 61 slices shown]
[im 13/61  bone]
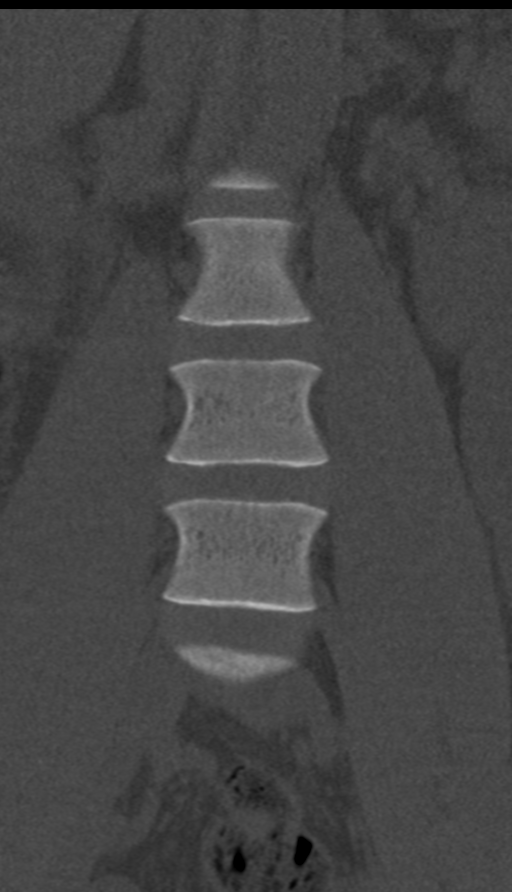
[im 25/61  bone]
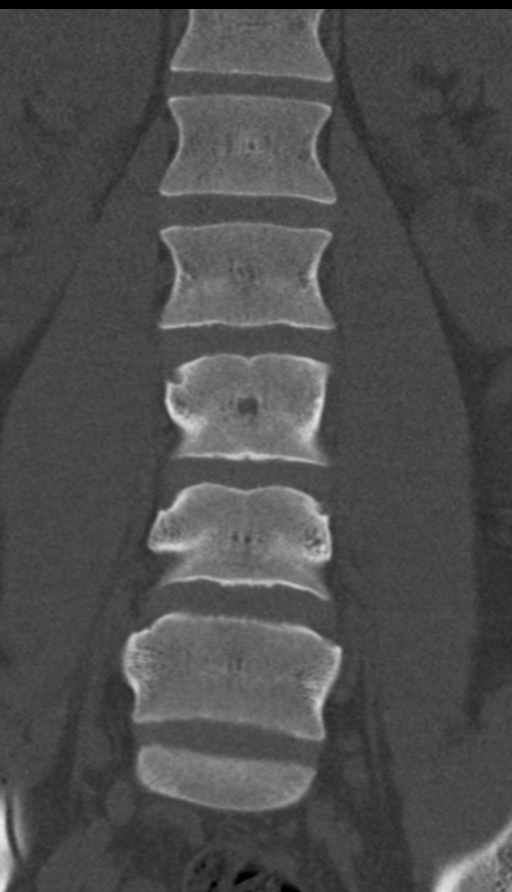
[im 37/61  bone]
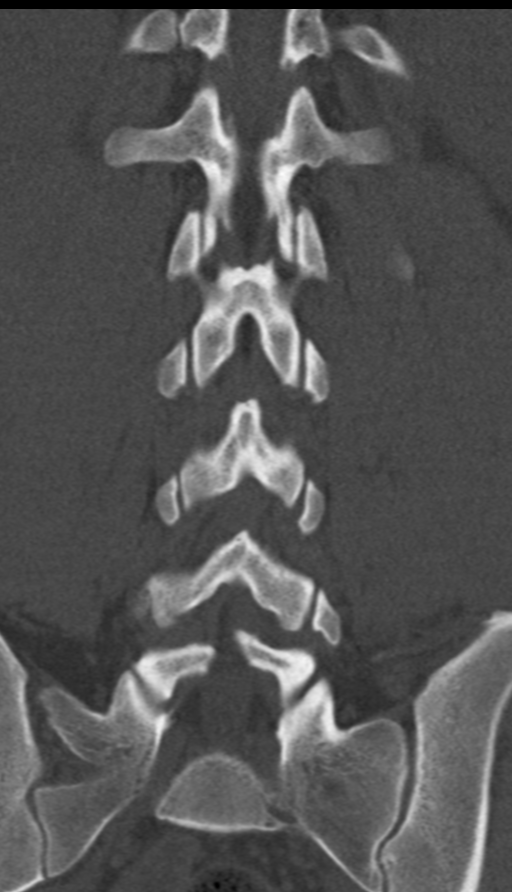

[11 of 33 positions shown; findings below may reference images not displayed]

FINDINGS: Segmentation: 5 lumbar type vertebrae.

Alignment: Normal.

Vertebrae: No acute fracture or focal pathologic process.

Paraspinal and other soft tissues: Negative

Disc levels: No visible herniation or focal degenerative changes.
IMPRESSION: Negative study

## 2023-08-28 ENCOUNTER — Encounter (HOSPITAL_COMMUNITY): Payer: Self-pay

## 2023-08-28 ENCOUNTER — Emergency Department (HOSPITAL_COMMUNITY)
Admission: EM | Admit: 2023-08-28 | Discharge: 2023-08-28 | Discharge status: Against Medical Advice | Payer: Medicaid Other | Attending: Emergency Medicine | Admitting: Emergency Medicine

## 2023-08-28 ENCOUNTER — Other Ambulatory Visit: Payer: Self-pay

## 2023-08-28 DIAGNOSIS — R1032 Left lower quadrant pain: Secondary | ICD-10-CM | POA: Diagnosis not present

## 2023-08-28 DIAGNOSIS — F17200 Nicotine dependence, unspecified, uncomplicated: Secondary | ICD-10-CM | POA: Insufficient documentation

## 2023-08-28 DIAGNOSIS — Z5321 Procedure and treatment not carried out due to patient leaving prior to being seen by health care provider: Secondary | ICD-10-CM | POA: Insufficient documentation

## 2023-08-28 DIAGNOSIS — R112 Nausea with vomiting, unspecified: Secondary | ICD-10-CM | POA: Insufficient documentation

## 2023-08-28 LAB — CBC WITH DIFFERENTIAL/PLATELET
Abs Immature Granulocytes: 0.07 10*3/uL (ref 0.00–0.07)
Basophils Absolute: 0.1 10*3/uL (ref 0.0–0.1)
Basophils Relative: 1 %
Eosinophils Absolute: 0.1 10*3/uL (ref 0.0–0.5)
Eosinophils Relative: 1 %
HCT: 47.8 % (ref 39.0–52.0)
Hemoglobin: 16.9 g/dL (ref 13.0–17.0)
Immature Granulocytes: 1 %
Lymphocytes Relative: 24 %
Lymphs Abs: 3.4 10*3/uL (ref 0.7–4.0)
MCH: 30.1 pg (ref 26.0–34.0)
MCHC: 35.4 g/dL (ref 30.0–36.0)
MCV: 85.1 fL (ref 80.0–100.0)
Monocytes Absolute: 0.9 10*3/uL (ref 0.1–1.0)
Monocytes Relative: 6 %
Neutro Abs: 9.5 10*3/uL — ABNORMAL HIGH (ref 1.7–7.7)
Neutrophils Relative %: 67 %
Platelets: 259 10*3/uL (ref 150–400)
RBC: 5.62 MIL/uL (ref 4.22–5.81)
RDW: 12.4 % (ref 11.5–15.5)
WBC: 14.1 10*3/uL — ABNORMAL HIGH (ref 4.0–10.5)
nRBC: 0 % (ref 0.0–0.2)

## 2023-08-28 LAB — COMPREHENSIVE METABOLIC PANEL
ALT: 11 U/L (ref 0–44)
AST: 13 U/L — ABNORMAL LOW (ref 15–41)
Albumin: 4.4 g/dL (ref 3.5–5.0)
Alkaline Phosphatase: 91 U/L (ref 38–126)
Anion gap: 8 (ref 5–15)
BUN: 9 mg/dL (ref 6–20)
CO2: 23 mmol/L (ref 22–32)
Calcium: 9.3 mg/dL (ref 8.9–10.3)
Chloride: 105 mmol/L (ref 98–111)
Creatinine, Ser: 0.78 mg/dL (ref 0.61–1.24)
GFR, Estimated: >60 mL/min (ref >60)
Glucose, Bld: 91 mg/dL (ref 70–99)
Potassium: 3.4 mmol/L — ABNORMAL LOW (ref 3.5–5.1)
Sodium: 136 mmol/L (ref 135–145)
Total Bilirubin: 0.8 mg/dL (ref 0.0–1.2)
Total Protein: 7.4 g/dL (ref 6.5–8.1)

## 2023-08-28 LAB — LIPASE, BLOOD: Lipase: 35 U/L (ref 11–51)

## 2023-08-28 MED ORDER — ONDANSETRON HCL 4 MG/2ML IJ SOLN: 4.0000 mg | Freq: Once | INTRAMUSCULAR | Status: DC

## 2023-08-28 NOTE — ED Provider Triage Note (Signed)
 Emergency Medicine Provider Triage Evaluation Note  Gabriel Reed , a 33 y.o. male  was evaluated in triage.  Pt complains of nausea, vomiting, abdominal pain.  Review of Systems  Positive:  Negative:   Physical Exam  BP 111/82 (BP Location: Right Arm)   Pulse 64   Temp 97.9 F (36.6 C) (Oral)   Resp 16   SpO2 95%  Gen:   Awake, no distress   Resp:  Normal effort  MSK:   Moves extremities without difficulty  Other:    Medical Decision Making  Medically screening exam initiated at 10:15 AM.  Appropriate orders placed.  Gabriel Reed was informed that the remainder of the evaluation will be completed by another provider, this initial triage assessment does not replace that evaluation, and the importance of remaining in the ED until their evaluation is complete.  Nausea, vomiting, and LLQ pain. Patient stating that he vomits at baseline because he smokes, but vomit looked different today with brown chunks. LLQ abdominal pain is also intermittent and chronic but was worse last night. Also states that he feels spaced out but not dizzy.   Hoy Nidia FALCON, [place] 08/28/23 1017

## 2023-08-28 NOTE — ED Triage Notes (Signed)
 Pt arrives via [facility]AR from home. PT reports nausea, vomiting, and left lower abdominal pain. PT AxOx4.

## 2023-09-30 ENCOUNTER — Ambulatory Visit (INDEPENDENT_AMBULATORY_CARE_PROVIDER_SITE_OTHER): Payer: Medicaid Other | Admitting: Family

## 2023-09-30 ENCOUNTER — Encounter: Payer: Self-pay | Admitting: Family

## 2023-09-30 VITALS — BP 127/84 | HR 60 | Temp 98.0°F | Ht 73.0 in | Wt 144.4 lb

## 2023-09-30 DIAGNOSIS — M549 Dorsalgia, unspecified: Secondary | ICD-10-CM

## 2023-09-30 DIAGNOSIS — R21 Rash and other nonspecific skin eruption: Secondary | ICD-10-CM | POA: Diagnosis not present

## 2023-09-30 DIAGNOSIS — G8929 Other chronic pain: Secondary | ICD-10-CM

## 2023-09-30 DIAGNOSIS — Z13228 Encounter for screening for other metabolic disorders: Secondary | ICD-10-CM

## 2023-09-30 DIAGNOSIS — Z7689 Persons encountering health services in other specified circumstances: Secondary | ICD-10-CM

## 2023-09-30 MED ORDER — TRIAMCINOLONE ACETONIDE 0.025 % EX CREA: 1.0000 | TOPICAL_CREAM | Freq: Two times a day (BID) | CUTANEOUS | 1 refills | Status: AC
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
## 2023-10-01 ENCOUNTER — Encounter: Payer: Self-pay | Admitting: Family

## 2023-10-01 LAB — CBC/DIFF AMBIGUOUS DEFAULT
Basophils Absolute: 0.1 10*3/uL (ref 0.0–0.2)
Basos: 1 %
EOS (ABSOLUTE): 0.1 10*3/uL (ref 0.0–0.4)
Eos: 1 %
Hematocrit: 51 % (ref 37.5–51.0)
Hemoglobin: 17.2 g/dL (ref 13.0–17.7)
Immature Grans (Abs): 0 10*3/uL (ref 0.0–0.1)
Immature Granulocytes: 0 %
Lymphocytes Absolute: 2.5 10*3/uL (ref 0.7–3.1)
Lymphs: 25 %
MCH: 29.7 pg (ref 26.6–33.0)
MCHC: 33.7 g/dL (ref 31.5–35.7)
MCV: 88 fL (ref 79–97)
Monocytes Absolute: 0.7 10*3/uL (ref 0.1–0.9)
Monocytes: 7 %
Neutrophils Absolute: 6.7 10*3/uL (ref 1.4–7.0)
Neutrophils: 66 %
Platelets: 265 10*3/uL (ref 150–450)
RBC: 5.8 x10E6/uL (ref 4.14–5.80)
RDW: 12.8 % (ref 11.6–15.4)
WBC: 10.1 10*3/uL (ref 3.4–10.8)

## 2023-10-01 LAB — SPECIMEN STATUS REPORT

## 2023-10-01 LAB — POTASSIUM: Potassium: 4.5 mmol/L (ref 3.5–5.2)

## 2023-10-13 ENCOUNTER — Ambulatory Visit (INDEPENDENT_AMBULATORY_CARE_PROVIDER_SITE_OTHER): Payer: Medicaid Other | Admitting: Physical Medicine and Rehabilitation

## 2023-10-13 ENCOUNTER — Encounter: Payer: Self-pay | Admitting: Physical Medicine and Rehabilitation

## 2023-10-13 VITALS — BP 127/74 | HR 80

## 2023-10-13 DIAGNOSIS — M7918 Myalgia, other site: Secondary | ICD-10-CM

## 2023-10-13 DIAGNOSIS — W3400XA Accidental discharge from unspecified firearms or gun, initial encounter: Secondary | ICD-10-CM

## 2023-10-13 DIAGNOSIS — M546 Pain in thoracic spine: Secondary | ICD-10-CM | POA: Diagnosis not present

## 2023-10-13 DIAGNOSIS — G8929 Other chronic pain: Secondary | ICD-10-CM | POA: Diagnosis not present

## 2023-10-13 DIAGNOSIS — M545 Low back pain, unspecified: Secondary | ICD-10-CM

## 2023-10-13 NOTE — Progress Notes (Signed)
 Pain Scale--6
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
## 2023-10-15 ENCOUNTER — Ambulatory Visit: Payer: Medicaid Other | Admitting: Family

## 2023-11-04 ENCOUNTER — Encounter: Payer: Medicaid Other | Admitting: Family

## 2023-11-04 ENCOUNTER — Telehealth: Payer: Self-pay | Admitting: Family

## 2023-11-04 NOTE — Telephone Encounter (Signed)
 Called pt and left vm to call office back to reschedule missed physical appt

## 2023-11-04 NOTE — Progress Notes (Signed)
 Erroneous encounter-disregard

## 2023-11-06 ENCOUNTER — Ambulatory Visit
Admission: RE | Admit: 2023-11-06 | Discharge: 2023-11-06 | Disposition: A | Discharge status: Home / Self Care | Payer: Medicaid Other | Source: Ambulatory Visit | Attending: Physical Medicine and Rehabilitation | Admitting: Physical Medicine and Rehabilitation

## 2023-11-06 DIAGNOSIS — M7918 Myalgia, other site: Secondary | ICD-10-CM

## 2023-11-06 DIAGNOSIS — W3400XA Accidental discharge from unspecified firearms or gun, initial encounter: Secondary | ICD-10-CM

## 2023-11-06 DIAGNOSIS — M545 Low back pain, unspecified: Secondary | ICD-10-CM

## 2023-11-06 DIAGNOSIS — G8929 Other chronic pain: Secondary | ICD-10-CM

## 2023-11-10 ENCOUNTER — Ambulatory Visit (INDEPENDENT_AMBULATORY_CARE_PROVIDER_SITE_OTHER): Payer: Medicaid Other | Admitting: Orthopedic Surgery

## 2023-11-10 DIAGNOSIS — W3400XA Accidental discharge from unspecified firearms or gun, initial encounter: Secondary | ICD-10-CM

## 2023-11-10 DIAGNOSIS — M546 Pain in thoracic spine: Secondary | ICD-10-CM

## 2023-11-10 DIAGNOSIS — G8929 Other chronic pain: Secondary | ICD-10-CM | POA: Diagnosis not present

## 2023-11-10 MED ORDER — GABAPENTIN 300 MG PO CAPS: 300.0000 mg | ORAL_CAPSULE | Freq: Two times a day (BID) | ORAL | 2 refills | Status: AC
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
## 2023-12-03 ENCOUNTER — Ambulatory Visit: Attending: Orthopedic Surgery | Admitting: Physical Therapy

## 2024-01-08 ENCOUNTER — Ambulatory Visit: Admitting: Orthopedic Surgery

## 2024-01-08 DIAGNOSIS — M546 Pain in thoracic spine: Secondary | ICD-10-CM

## 2024-01-08 DIAGNOSIS — G8929 Other chronic pain: Secondary | ICD-10-CM | POA: Diagnosis not present

## 2024-01-08 MED ORDER — METHYLPREDNISOLONE 4 MG PO TBPK: ORAL_TABLET | ORAL | 0 refills | Status: AC

## 2024-01-08 NOTE — Progress Notes (Signed)
 Orthopedic Office Note  Patient with continued thoracic back pain.  He is not having any radiating leg pain.  He has not noticed any changes in his symptoms since he was last seen in the office but says that the pain has gotten worse in the same area.  He said the medications are no longer providing him with tolerable relief.  She has not noticed any new symptoms except for worsening of his back pain.  He is interested in going to pain management which she was hoping to avoid.  On exam, he is in no acute distress.  He ambulates well without any assistive devices.  He has full strength in all myotomes in his lower extremities.  To help him with his worsening pain, prescribed a Medrol Dosepak.  I placed a referral to [facility] for pain management.  Patient can return to the office on an as-needed basis.   Diedra Fowler, MD Orthopedic Surgeon

## 2024-01-13 ENCOUNTER — Ambulatory Visit: Admitting: Orthopedic Surgery

## 2024-02-16 ENCOUNTER — Telehealth: Payer: Self-pay | Admitting: Physical Medicine and Rehabilitation
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
## 2024-04-28 ENCOUNTER — Encounter: Admitting: Family

## 2024-06-28 ENCOUNTER — Encounter: Payer: Self-pay | Admitting: Radiology

## 2024-09-22 ENCOUNTER — Ambulatory Visit: Admitting: Orthopedic Surgery
